# Patient Record
Sex: Female | Born: 1975 | Hispanic: No | Marital: Married | Smoking: Never smoker
Health system: Southern US, Community
[De-identification: ages and names within clinical notes are randomized; demographics above are authoritative.]

## PROBLEM LIST (undated history)

## (undated) ENCOUNTER — Inpatient Hospital Stay (HOSPITAL_COMMUNITY): Payer: Self-pay

## (undated) DIAGNOSIS — Z789 Other specified health status: Secondary | ICD-10-CM

## (undated) DIAGNOSIS — F32A Depression, unspecified: Secondary | ICD-10-CM

## (undated) DIAGNOSIS — K219 Gastro-esophageal reflux disease without esophagitis: Secondary | ICD-10-CM

## (undated) DIAGNOSIS — Z8719 Personal history of other diseases of the digestive system: Secondary | ICD-10-CM

## (undated) DIAGNOSIS — R87629 Unspecified abnormal cytological findings in specimens from vagina: Secondary | ICD-10-CM

## (undated) DIAGNOSIS — M1990 Unspecified osteoarthritis, unspecified site: Secondary | ICD-10-CM

## (undated) HISTORY — PX: GYNECOLOGIC CRYOSURGERY: SHX857

## (undated) HISTORY — PX: CYSTECTOMY: SUR359

## (undated) HISTORY — PX: TUBAL LIGATION: SHX77

## (undated) HISTORY — DX: Unspecified osteoarthritis, unspecified site: M19.90

## (undated) HISTORY — DX: Other specified health status: Z78.9

## (undated) HISTORY — PX: BUNIONECTOMY: SHX129

## (undated) HISTORY — DX: Depression, unspecified: F32.A

## (undated) HISTORY — PX: CHOLECYSTECTOMY: SHX55

---

## 2016-05-06 ENCOUNTER — Other Ambulatory Visit (HOSPITAL_COMMUNITY): Payer: Self-pay | Admitting: Obstetrics

## 2016-05-06 DIAGNOSIS — Z3149 Encounter for other procreative investigation and testing: Secondary | ICD-10-CM

## 2016-05-13 ENCOUNTER — Ambulatory Visit (HOSPITAL_COMMUNITY)
Admission: RE | Admit: 2016-05-13 | Discharge: 2016-05-13 | Disposition: A | Discharge status: Home / Self Care | Payer: Managed Care, Other (non HMO) | Source: Ambulatory Visit | Attending: Obstetrics | Admitting: Obstetrics

## 2016-05-13 ENCOUNTER — Encounter (HOSPITAL_COMMUNITY): Payer: Self-pay | Admitting: Radiology

## 2016-05-13 DIAGNOSIS — Z3149 Encounter for other procreative investigation and testing: Secondary | ICD-10-CM | POA: Diagnosis present

## 2016-05-13 MED ORDER — IOPAMIDOL (ISOVUE-300) INJECTION 61%
30.0000 mL | Freq: Once | INTRAVENOUS | Status: AC | PRN
  Administered 2016-05-13: 3 mL

## 2017-05-16 LAB — OB RESULTS CONSOLE RPR: RPR: NONREACTIVE

## 2017-05-16 LAB — OB RESULTS CONSOLE RUBELLA ANTIBODY, IGM: RUBELLA: IMMUNE

## 2017-05-16 LAB — OB RESULTS CONSOLE ABO/RH: RH Type: POSITIVE

## 2017-05-16 LAB — OB RESULTS CONSOLE GC/CHLAMYDIA
CHLAMYDIA, DNA PROBE: NEGATIVE
GC PROBE AMP, GENITAL: NEGATIVE

## 2017-05-16 LAB — OB RESULTS CONSOLE HIV ANTIBODY (ROUTINE TESTING): HIV: NONREACTIVE

## 2017-05-16 LAB — OB RESULTS CONSOLE ANTIBODY SCREEN: Antibody Screen: NEGATIVE

## 2017-05-16 LAB — OB RESULTS CONSOLE HEPATITIS B SURFACE ANTIGEN: Hepatitis B Surface Ag: NEGATIVE

## 2017-11-12 LAB — OB RESULTS CONSOLE GBS: GBS: NEGATIVE

## 2017-11-28 ENCOUNTER — Encounter (HOSPITAL_COMMUNITY): Payer: Self-pay | Admitting: *Deleted

## 2017-11-28 ENCOUNTER — Telehealth (HOSPITAL_COMMUNITY): Payer: Self-pay | Admitting: *Deleted

## 2017-11-28 NOTE — Telephone Encounter (Signed)
Preadmission screen  

## 2017-12-01 ENCOUNTER — Other Ambulatory Visit: Payer: Self-pay | Admitting: Obstetrics

## 2017-12-10 ENCOUNTER — Other Ambulatory Visit: Payer: Self-pay

## 2017-12-10 ENCOUNTER — Inpatient Hospital Stay (HOSPITAL_COMMUNITY)
Admission: AD | Admit: 2017-12-10 | Discharge: 2017-12-10 | Disposition: A | Discharge status: Home / Self Care | Payer: Managed Care, Other (non HMO) | Source: Ambulatory Visit | Attending: Obstetrics and Gynecology | Admitting: Obstetrics and Gynecology

## 2017-12-10 ENCOUNTER — Encounter (HOSPITAL_COMMUNITY): Payer: Self-pay

## 2017-12-10 DIAGNOSIS — O3663X0 Maternal care for excessive fetal growth, third trimester, not applicable or unspecified: Secondary | ICD-10-CM | POA: Diagnosis not present

## 2017-12-10 DIAGNOSIS — G44209 Tension-type headache, unspecified, not intractable: Secondary | ICD-10-CM

## 2017-12-10 DIAGNOSIS — O26893 Other specified pregnancy related conditions, third trimester: Secondary | ICD-10-CM | POA: Diagnosis not present

## 2017-12-10 LAB — CBC WITH DIFFERENTIAL/PLATELET
BASOS PCT: 0 %
Basophils Absolute: 0 10*3/uL (ref 0.0–0.1)
Eosinophils Absolute: 0.2 10*3/uL (ref 0.0–0.7)
Eosinophils Relative: 2 %
HEMATOCRIT: 35.6 % — AB (ref 36.0–46.0)
Hemoglobin: 12 g/dL (ref 12.0–15.0)
LYMPHS ABS: 2.8 10*3/uL (ref 0.7–4.0)
LYMPHS PCT: 24 %
MCH: 31.7 pg (ref 26.0–34.0)
MCHC: 33.7 g/dL (ref 30.0–36.0)
MCV: 93.9 fL (ref 78.0–100.0)
MONO ABS: 0.6 10*3/uL (ref 0.1–1.0)
MONOS PCT: 5 %
NEUTROS ABS: 8.1 10*3/uL — AB (ref 1.7–7.7)
Neutrophils Relative %: 69 %
Platelets: 309 10*3/uL (ref 150–400)
RBC: 3.79 MIL/uL — ABNORMAL LOW (ref 3.87–5.11)
RDW: 14 % (ref 11.5–15.5)
WBC: 11.7 10*3/uL — ABNORMAL HIGH (ref 4.0–10.5)

## 2017-12-10 LAB — COMPREHENSIVE METABOLIC PANEL
ALT: 12 U/L — ABNORMAL LOW (ref 14–54)
ANION GAP: 10 (ref 5–15)
AST: 21 U/L (ref 15–41)
Albumin: 3 g/dL — ABNORMAL LOW (ref 3.5–5.0)
Alkaline Phosphatase: 247 U/L — ABNORMAL HIGH (ref 38–126)
BILIRUBIN TOTAL: 0.5 mg/dL (ref 0.3–1.2)
BUN: 14 mg/dL (ref 6–20)
CALCIUM: 8.6 mg/dL — AB (ref 8.9–10.3)
CO2: 19 mmol/L — ABNORMAL LOW (ref 22–32)
Chloride: 103 mmol/L (ref 101–111)
Creatinine, Ser: 0.68 mg/dL (ref 0.44–1.00)
Glucose, Bld: 79 mg/dL (ref 65–99)
Potassium: 4.1 mmol/L (ref 3.5–5.1)
Sodium: 132 mmol/L — ABNORMAL LOW (ref 135–145)
TOTAL PROTEIN: 6.9 g/dL (ref 6.5–8.1)

## 2017-12-10 LAB — PROTEIN / CREATININE RATIO, URINE
CREATININE, URINE: 135 mg/dL
PROTEIN CREATININE RATIO: 0.23 mg/mg{creat} — AB (ref 0.00–0.15)
Total Protein, Urine: 31 mg/dL

## 2017-12-10 MED ORDER — CYCLOBENZAPRINE HCL 10 MG PO TABS
10.0000 mg | ORAL_TABLET | Freq: Once | ORAL | Status: AC
  Administered 2017-12-10: 10 mg via ORAL
  Filled 2017-12-10: qty 1

## 2017-12-10 MED ORDER — CYCLOBENZAPRINE HCL 10 MG PO TABS: 10.0000 mg | ORAL_TABLET | Freq: Three times a day (TID) | ORAL | 0 refills | Status: DC | PRN

## 2017-12-10 NOTE — MAU Provider Note (Signed)
History     Chief Complaint  Patient presents with  . Headache  . Hypertension  . Foot Swelling   41 yo G2P0010 BF @ 39 5/[redacted] weeks gestation sent from office by Dr Amado Nash for evaluation of c/o posterior  Headache. Pt reports headache present since this morning . She has not taken any med for the h/a and notes it is aggravated by moving head. Denies epigastric pain or visual changes. Per pt, while her BP is not " super" high it is  Higher than her usual BP. Also c/o leg more swollen than usual.   OB History    Gravida Para Term Preterm AB Living   2       1     SAB TAB Ectopic Multiple Live Births   1              Past Medical History:  Diagnosis Date  . Medical history non-contributory     Past Surgical History:  Procedure Laterality Date  . BUNIONECTOMY    . CYSTECTOMY    . GYNECOLOGIC CRYOSURGERY      Family History  Problem Relation Age of Onset  . Thrombosis Father   . Breast cancer Paternal Aunt   . Colon cancer Paternal Aunt     Social History   Tobacco Use  . Smoking status: Never Smoker  . Smokeless tobacco: Never Used  Substance Use Topics  . Alcohol use: No    Frequency: Never  . Drug use: No    Allergies:  Allergies  Allergen Reactions  . Sulfa Antibiotics     No medications prior to admission.     Physical Exam   Blood pressure 125/75, pulse 72, temperature 98 F (36.7 C), temperature source Oral, resp. rate 17, weight 101 kg (222 lb 12 oz), SpO2 100 %.  General appearance: alert, cooperative and no distress Lungs: clear to auscultation bilaterally Heart: regular rate and rhythm, S1, S2 normal, no murmur, click, rub or gallop Abdomen: gravid nontender Extremities: no edema, redness or tenderness in the calves or thighs  Tracing. Reactive baseline 135 (+) accel 15 x 15 IMP:  Headache  Term gestation P) PIH labs.  ED Course   MDM Addendum:  CBC    Component Value Date/Time   WBC 11.7 (H) 12/10/2017 1737   RBC 3.79 (L)  12/10/2017 1737   HGB 12.0 12/10/2017 1737   HCT 35.6 (L) 12/10/2017 1737   PLT 309 12/10/2017 1737   MCV 93.9 12/10/2017 1737   MCH 31.7 12/10/2017 1737   MCHC 33.7 12/10/2017 1737   RDW 14.0 12/10/2017 1737   LYMPHSABS 2.8 12/10/2017 1737   MONOABS 0.6 12/10/2017 1737   EOSABS 0.2 12/10/2017 1737   BASOSABS 0.0 12/10/2017 1737   CMP Latest Ref Rng & Units 12/10/2017  Glucose 65 - 99 mg/dL 79  BUN 6 - 20 mg/dL 14  Creatinine 4.09 - 8.11 mg/dL 9.14  Sodium 782 - 956 mmol/L 132(L)  Potassium 3.5 - 5.1 mmol/L 4.1  Chloride 101 - 111 mmol/L 103  CO2 22 - 32 mmol/L 19(L)  Calcium 8.9 - 10.3 mg/dL 2.1(H)  Total Protein 6.5 - 8.1 g/dL 6.9  Total Bilirubin 0.3 - 1.2 mg/dL 0.5  Alkaline Phos 38 - 126 U/L 247(H)  AST 15 - 41 U/L 21  ALT 14 - 54 U/L 12(L)  protein/creatinine ratio: .23 IMP tension headache P) flexeril 10mg  po x 1now. Script given for prn use.  D/c home. Keep scheduled appointment  Lisa Strickland  Lisa BeamsA Lisa Harriott, MD 6:04 PM 12/10/2017

## 2017-12-10 NOTE — MAU Note (Signed)
Went for non-stress test.  Has a headache (has not taken anything), BP was elevated, increased swelling, protein in urine.  Sent for further eval.  Is to be induced on Fri.  Denies spots, or epigastric pain.

## 2017-12-10 NOTE — Discharge Instructions (Signed)
Call fri for urine culture results. Labor prec

## 2017-12-11 NOTE — H&P (Signed)
Estelle JuneSaranah Cocco is a 42 y.o. G2P0010 at 1486w0d presenting for IOL given advanced AMA at 41yo and marginal cord insertion . Pt notes rare contractions. Good fetal movement, No vaginal bleeding, not leaking fluid/  [facility] - Dated by IVF and early u/s - AMA. Normal PGD. AFP/quad returned as a positive screen due to >1/100 risk based on age alone but quad screen reduced her risk compared to risk of age alone - Marginal cord insertion - High weight gain- 47# - GERD - Fetal testing. U/s at 36 wks 6'9, 85%, AFI 19. Nl weekly fetal testing.    Prenatal Transfer Tool  Maternal Diabetes: No Genetic Screening: Normal Maternal Ultrasounds/Referrals: Normal Fetal Ultrasounds or other Referrals:  None Maternal Substance Abuse:  No Significant Maternal Medications:  None Significant Maternal Lab Results: None     OB History    Gravida Para Term Preterm AB Living   2       1     SAB TAB Ectopic Multiple Live Births   1             Past Medical History:  Diagnosis Date  . Medical history non-contributory    Past Surgical History:  Procedure Laterality Date  . BUNIONECTOMY    . CYSTECTOMY    . GYNECOLOGIC CRYOSURGERY     Family History: family history includes Breast cancer in her paternal aunt; Colon cancer in her paternal aunt; Thrombosis in her father. Social History:  reports that  has never smoked. she has never used smokeless tobacco. She reports that she does not drink alcohol or use drugs.  Review of Systems - Negative except discomfort of pregnancy     There were no vitals taken for this visit.  Physical Exam: per admitting MD    Prenatal labs: ABO, Rh: B/Positive/-- (07/27 0000) Antibody: Negative (07/27 0000) Rubella: Immune (07/27 0000) RPR: Nonreactive (07/27 0000)  HBsAg: Negative (07/27 0000)  HIV: Non-reactive (07/27 0000)  GBS: Negative (01/23 0000)  1 hr Glucola 87  Genetic screening normal PGD, quad lowers risk vs age  alone Anatomy US normal   Assessment/Plan: 42 y.o. G2P0010 at 40'0 - Plan IOL, cytotec x 2 then pitocin in am. AROM when able. - GBS neg - AMA - LGA with borderline pelvis, attempt at vaginal delivery   Lendon ColonelKelly A Arshad Oberholzer 12/11/2017, 10:14 PM

## 2017-12-12 ENCOUNTER — Inpatient Hospital Stay (HOSPITAL_COMMUNITY): Payer: Managed Care, Other (non HMO) | Admitting: Anesthesiology

## 2017-12-12 ENCOUNTER — Inpatient Hospital Stay (HOSPITAL_COMMUNITY)
Admission: RE | Admit: 2017-12-12 | Discharge: 2017-12-15 | DRG: 787 | Disposition: A | Discharge status: Home / Self Care | Payer: Managed Care, Other (non HMO) | Source: Ambulatory Visit | Attending: Obstetrics and Gynecology | Admitting: Obstetrics and Gynecology

## 2017-12-12 ENCOUNTER — Other Ambulatory Visit: Payer: Self-pay

## 2017-12-12 ENCOUNTER — Encounter (HOSPITAL_COMMUNITY): Payer: Self-pay

## 2017-12-12 DIAGNOSIS — O99214 Obesity complicating childbirth: Secondary | ICD-10-CM | POA: Diagnosis present

## 2017-12-12 DIAGNOSIS — O324XX0 Maternal care for high head at term, not applicable or unspecified: Secondary | ICD-10-CM | POA: Diagnosis present

## 2017-12-12 DIAGNOSIS — O26893 Other specified pregnancy related conditions, third trimester: Secondary | ICD-10-CM | POA: Diagnosis present

## 2017-12-12 DIAGNOSIS — O43123 Velamentous insertion of umbilical cord, third trimester: Secondary | ICD-10-CM | POA: Diagnosis present

## 2017-12-12 DIAGNOSIS — E669 Obesity, unspecified: Secondary | ICD-10-CM | POA: Diagnosis present

## 2017-12-12 DIAGNOSIS — Z3A40 40 weeks gestation of pregnancy: Secondary | ICD-10-CM

## 2017-12-12 DIAGNOSIS — Z3490 Encounter for supervision of normal pregnancy, unspecified, unspecified trimester: Secondary | ICD-10-CM | POA: Diagnosis present

## 2017-12-12 DIAGNOSIS — D62 Acute posthemorrhagic anemia: Secondary | ICD-10-CM | POA: Diagnosis not present

## 2017-12-12 DIAGNOSIS — O3663X0 Maternal care for excessive fetal growth, third trimester, not applicable or unspecified: Secondary | ICD-10-CM | POA: Diagnosis present

## 2017-12-12 DIAGNOSIS — O9081 Anemia of the puerperium: Secondary | ICD-10-CM | POA: Diagnosis not present

## 2017-12-12 LAB — COMPREHENSIVE METABOLIC PANEL
ALT: 11 U/L — ABNORMAL LOW (ref 14–54)
AST: 20 U/L (ref 15–41)
Albumin: 2.8 g/dL — ABNORMAL LOW (ref 3.5–5.0)
Alkaline Phosphatase: 238 U/L — ABNORMAL HIGH (ref 38–126)
Anion gap: 8 (ref 5–15)
BUN: 11 mg/dL (ref 6–20)
CHLORIDE: 104 mmol/L (ref 101–111)
CO2: 20 mmol/L — ABNORMAL LOW (ref 22–32)
Calcium: 8.7 mg/dL — ABNORMAL LOW (ref 8.9–10.3)
Creatinine, Ser: 0.66 mg/dL (ref 0.44–1.00)
GFR calc Af Amer: >60 mL/min (ref >60)
GLUCOSE: 83 mg/dL (ref 65–99)
POTASSIUM: 4 mmol/L (ref 3.5–5.1)
Sodium: 132 mmol/L — ABNORMAL LOW (ref 135–145)
Total Bilirubin: 0.4 mg/dL (ref 0.3–1.2)
Total Protein: 6.7 g/dL (ref 6.5–8.1)

## 2017-12-12 LAB — CBC
HCT: 33.7 % — ABNORMAL LOW (ref 36.0–46.0)
HEMOGLOBIN: 11.3 g/dL — AB (ref 12.0–15.0)
MCH: 31.6 pg (ref 26.0–34.0)
MCHC: 33.5 g/dL (ref 30.0–36.0)
MCV: 94.1 fL (ref 78.0–100.0)
PLATELETS: 302 10*3/uL (ref 150–400)
RBC: 3.58 MIL/uL — AB (ref 3.87–5.11)
RDW: 13.9 % (ref 11.5–15.5)
WBC: 11.5 10*3/uL — ABNORMAL HIGH (ref 4.0–10.5)

## 2017-12-12 LAB — TYPE AND SCREEN
ABO/RH(D): B POS
Antibody Screen: NEGATIVE

## 2017-12-12 LAB — RPR: RPR: NONREACTIVE

## 2017-12-12 LAB — ABO/RH: ABO/RH(D): B POS

## 2017-12-12 MED ORDER — BUTORPHANOL TARTRATE 1 MG/ML IJ SOLN
INTRAMUSCULAR | Status: AC
  Administered 2017-12-12: 1 mg via INTRAVENOUS
  Filled 2017-12-12: qty 1

## 2017-12-12 MED ORDER — ACETAMINOPHEN 325 MG PO TABS
650.0000 mg | ORAL_TABLET | ORAL | Status: DC | PRN
  Administered 2017-12-12: 650 mg via ORAL
  Filled 2017-12-12: qty 2

## 2017-12-12 MED ORDER — LACTATED RINGERS IV SOLN
500.0000 mL | Freq: Once | INTRAVENOUS | Status: AC
  Administered 2017-12-12: 500 mL via INTRAVENOUS

## 2017-12-12 MED ORDER — PHENYLEPHRINE 40 MCG/ML (10ML) SYRINGE FOR IV PUSH (FOR BLOOD PRESSURE SUPPORT)
80.0000 ug | PREFILLED_SYRINGE | INTRAVENOUS | Status: DC | PRN
Start: 2017-12-12 — End: 2017-12-13
  Filled 2017-12-12: qty 10

## 2017-12-12 MED ORDER — SOD CITRATE-CITRIC ACID 500-334 MG/5ML PO SOLN
30.0000 mL | ORAL | Status: DC | PRN
Start: 2017-12-12 — End: 2017-12-13
  Administered 2017-12-13 (×2): 30 mL via ORAL
  Filled 2017-12-12 (×2): qty 15

## 2017-12-12 MED ORDER — ZOLPIDEM TARTRATE 5 MG PO TABS
5.0000 mg | ORAL_TABLET | Freq: Every evening | ORAL | Status: DC | PRN
  Administered 2017-12-12: 5 mg via ORAL
  Filled 2017-12-12: qty 1

## 2017-12-12 MED ORDER — LACTATED RINGERS IV SOLN
INTRAVENOUS | Status: DC
  Administered 2017-12-12 (×2): via INTRAVENOUS

## 2017-12-12 MED ORDER — EPHEDRINE 5 MG/ML INJ: 10.0000 mg | INTRAVENOUS | Status: DC | PRN

## 2017-12-12 MED ORDER — LACTATED RINGERS IV SOLN: 500.0000 mL | Freq: Once | INTRAVENOUS | Status: DC

## 2017-12-12 MED ORDER — FENTANYL 2.5 MCG/ML BUPIVACAINE 1/10 % EPIDURAL INFUSION (WH - ANES)
14.0000 mL/h | INTRAMUSCULAR | Status: DC | PRN
  Administered 2017-12-12 (×2): 14 mL/h via EPIDURAL
  Filled 2017-12-12 (×2): qty 100

## 2017-12-12 MED ORDER — OXYTOCIN 40 UNITS IN LACTATED RINGERS INFUSION - SIMPLE MED
1.0000 m[IU]/min | INTRAVENOUS | Status: DC
  Administered 2017-12-12: 2 m[IU]/min via INTRAVENOUS
  Filled 2017-12-12: qty 1000

## 2017-12-12 MED ORDER — TERBUTALINE SULFATE 1 MG/ML IJ SOLN: 0.2500 mg | Freq: Once | INTRAMUSCULAR | Status: DC | PRN

## 2017-12-12 MED ORDER — DIPHENHYDRAMINE HCL 50 MG/ML IJ SOLN: 12.5000 mg | INTRAMUSCULAR | Status: DC | PRN

## 2017-12-12 MED ORDER — LIDOCAINE HCL (PF) 1 % IJ SOLN
INTRAMUSCULAR | Status: DC | PRN
  Administered 2017-12-12 (×2): 4 mL via EPIDURAL

## 2017-12-12 MED ORDER — MISOPROSTOL 25 MCG QUARTER TABLET
25.0000 ug | ORAL_TABLET | ORAL | Status: AC | PRN
  Administered 2017-12-12 (×2): 25 ug via VAGINAL
  Filled 2017-12-12 (×2): qty 1

## 2017-12-12 MED ORDER — BUTORPHANOL TARTRATE 1 MG/ML IJ SOLN
1.0000 mg | Freq: Once | INTRAMUSCULAR | Status: AC
  Administered 2017-12-12: 1 mg via INTRAVENOUS

## 2017-12-12 MED ORDER — LACTATED RINGERS IV SOLN: 500.0000 mL | INTRAVENOUS | Status: DC | PRN

## 2017-12-12 MED ORDER — PHENYLEPHRINE 40 MCG/ML (10ML) SYRINGE FOR IV PUSH (FOR BLOOD PRESSURE SUPPORT): 80.0000 ug | PREFILLED_SYRINGE | INTRAVENOUS | Status: DC | PRN

## 2017-12-12 MED ORDER — ONDANSETRON HCL 4 MG/2ML IJ SOLN
4.0000 mg | Freq: Four times a day (QID) | INTRAMUSCULAR | Status: DC | PRN
  Administered 2017-12-12: 4 mg via INTRAVENOUS
  Filled 2017-12-12: qty 2

## 2017-12-12 MED ORDER — BUTORPHANOL TARTRATE 1 MG/ML IJ SOLN
1.0000 mg | Freq: Once | INTRAMUSCULAR | Status: AC
Start: 2017-12-12 — End: 2017-12-12
  Administered 2017-12-12: 1 mg via INTRAVENOUS
  Filled 2017-12-12: qty 1

## 2017-12-12 NOTE — Progress Notes (Addendum)
Comfortable with epidural  Patient Vitals for the past 24 hrs:  BP Temp Temp src Pulse Resp Height Weight  12/12/17 1931 136/75 98.1 F (36.7 C) Oral 81 18 - -  12/12/17 1901 138/71 - - 79 - - -  12/12/17 1830 (!) 145/87 - - 85 16 - -  12/12/17 1800 135/78 - - 70 18 - -  12/12/17 1730 129/72 - - 78 16 - -  12/12/17 1710 130/81 - - 76 18 - -  12/12/17 1700 129/76 - - 72 18 - -  12/12/17 1655 128/79 - - 70 16 - -  12/12/17 1650 124/77 - - 70 16 - -  12/12/17 1645 132/79 - - 69 16 - -  12/12/17 1640 131/77 98.6 F (37 C) Oral 71 16 - -  12/12/17 1636 139/83 - - 74 16 - -  12/12/17 1633 135/77 - - 77 18 - -  12/12/17 1628 137/76 - - 80 16 - -  12/12/17 1520 138/72 - - 80 16 - -  12/12/17 1441 (!) 145/79 97.9 F (36.6 C) Axillary 71 18 - -  12/12/17 1320 138/75 - - 76 16 - -  12/12/17 1250 140/69 - - 71 18 - -  12/12/17 1206 138/80 98.5 F (36.9 C) Oral 72 18 - -  12/12/17 1050 138/83 - - 78 18 - -  12/12/17 1049 - 98.7 F (37.1 C) Oral - - - -  12/12/17 0835 139/80 - - 77 18 - -  12/12/17 0730 127/78 - - 78 16 - -  12/12/17 0542 (!) 144/71 98.4 F (36.9 C) Oral 84 18 - -  12/12/17 0444 - - - - 18 - -  12/12/17 0341 - - - - 16 - -  12/12/17 0250 - - - - 18 - -  12/12/17 0140 137/73 - - 71 - - -  12/12/17 0137 137/73 98.1 F (36.7 C) Oral 73 18 - -  12/12/17 0127 (!) 143/76 - - 74 18 - -  12/12/17 0124 - 98.7 F (37.1 C) Oral - - 5' 5.5" (1.664 m) 224 lb (101.6 kg)   A&ox3 Normal respirations Abd: soft, nt, gravid Cx: 4/90/-3, cephalic LE: 2+ edema, nt bilat  FHT: 140, normal variability, +accels, no decels Toco: irregular q 2-3  A/P: iup at 40.0 wga 1. Active labor - continue pitocin, now at 4416mU/min; cervical change made but will follow closely, fetal station unchanged 2. Fetal status reassuring 3. AMA  4. Marginal cord insertion; efw 85% 6'9" 4 wks ago 5. gbs neg 6. ivf pregnancy  iupc placed d/t difficulty monitoring contractions with peanut Cx: 5/90/-3,  IUPC placed w/o difficulty Pt tolerated well

## 2017-12-12 NOTE — Progress Notes (Signed)
Pt beginning to feel contractions, mildly painful Plans epidural when slightly more painful  Patient Vitals for the past 24 hrs:  BP Temp Temp src Pulse Resp Height Weight  12/12/17 1520 138/72 - - 80 16 - -  12/12/17 1441 (!) 145/79 97.9 F (36.6 C) Axillary 71 18 - -  12/12/17 1320 138/75 - - 76 16 - -  12/12/17 1250 140/69 - - 71 18 - -  12/12/17 1206 138/80 98.5 F (36.9 C) Oral 72 18 - -  12/12/17 1050 138/83 - - 78 18 - -  12/12/17 1049 - 98.7 F (37.1 C) Oral - - - -  12/12/17 0835 139/80 - - 77 18 - -  12/12/17 0730 127/78 - - 78 16 - -  12/12/17 0542 (!) 144/71 98.4 F (36.9 C) Oral 84 18 - -  12/12/17 0444 - - - - 18 - -  12/12/17 0341 - - - - 16 - -  12/12/17 0250 - - - - 18 - -  12/12/17 0140 137/73 - - 71 - - -  12/12/17 0137 137/73 98.1 F (36.7 C) Oral 73 18 - -  12/12/17 0127 (!) 143/76 - - 74 18 - -  12/12/17 0124 - 98.7 F (37.1 C) Oral - - 5' 5.5" (1.664 m) 224 lb (101.6 kg)   A&ox3 Normal respirations Abd: soft, nt; efw 8-9 lbs LE: +2 edema, nt bilat  FHT: 130s, normal variability, +accels, no decels TOCO: irregular, mild ctx q 2-3 min  Results for orders placed or performed during the hospital encounter of 12/12/17 (from the past 24 hour(s))  CBC     Status: Abnormal   Collection Time: 12/12/17  1:09 AM  Result Value Ref Range   WBC 11.5 (H) 4.0 - 10.5 K/uL   RBC 3.58 (L) 3.87 - 5.11 MIL/uL   Hemoglobin 11.3 (L) 12.0 - 15.0 g/dL   HCT 47.833.7 (L) 29.536.0 - 62.146.0 %   MCV 94.1 78.0 - 100.0 fL   MCH 31.6 26.0 - 34.0 pg   MCHC 33.5 30.0 - 36.0 g/dL   RDW 30.813.9 65.711.5 - 84.615.5 %   Platelets 302 150 - 400 K/uL  RPR     Status: None   Collection Time: 12/12/17  1:09 AM  Result Value Ref Range   RPR Ser Ql Non Reactive Non Reactive  Type and screen     Status: None   Collection Time: 12/12/17  1:09 AM  Result Value Ref Range   ABO/RH(D) B POS    Antibody Screen NEG    Sample Expiration      12/15/2017 Performed at [facility], [address]  [place], [place] [address]   ABO/Rh     Status: None   Collection Time: 12/12/17  1:09 AM  Result Value Ref Range   ABO/RH(D)      B POS Performed at [facility], [address] [place], [place] [address]   Comprehensive metabolic panel     Status: Abnormal   Collection Time: 12/12/17  6:50 AM  Result Value Ref Range   Sodium 132 (L) 135 - 145 mmol/L   Potassium 4.0 3.5 - 5.1 mmol/L   Chloride 104 101 - 111 mmol/L   CO2 20 (L) 22 - 32 mmol/L   Glucose, Bld 83 65 - 99 mg/dL   BUN 11 6 - 20 mg/dL   Creatinine, Ser 2.440.66 0.44 - 1.00 mg/dL   Calcium 8.7 (L) 8.9 - 10.3 mg/dL   Total Protein  6.7 6.5 - 8.1 g/dL   Albumin 2.8 (L) 3.5 - 5.0 g/dL   AST 20 15 - 41 U/L   ALT 11 (L) 14 - 54 U/L   Alkaline Phosphatase 238 (H) 38 - 126 U/L   Total Bilirubin 0.4 0.3 - 1.2 mg/dL   GFR calc non Af Amer >60 >60 mL/min   GFR calc Af Amer >60 >60 mL/min   Anion gap 8 5 - 15   A/p: iup at 40.0 1. Continue pitocin IOL - plan svd, appears lga and will watch for arrest of labor 2. Fetal status reassuring 3. ivf 4. ama

## 2017-12-12 NOTE — Anesthesia Pain Management Evaluation Note (Signed)
  CRNA Pain Management Visit Note  Patient: Lisa Strickland, 42 y.o., female  "Hello I am a member of the anesthesia team at [facility]. We have an anesthesia team available at all times to provide care throughout the hospital, including epidural management and anesthesia for C-section. I don't know your plan for the delivery whether it a natural birth, water birth, IV sedation, nitrous supplementation, doula or epidural, but we want to meet your pain goals."   1.Was your pain managed to your expectations on prior hospitalizations?   Yes   2.What is your expectation for pain management during this hospitalization?     Epidural  3.How can we help you reach that goal? epidural  Record the patient's initial score and the patient's pain goal.   Pain: 4  Pain Goal: 6 [facility] wants you to be able to say your pain was always managed very well.  Juan Olthoff 12/12/2017

## 2017-12-12 NOTE — Anesthesia Preprocedure Evaluation (Addendum)
Anesthesia Evaluation  Patient identified by MRN, date of birth, ID band Patient awake    Reviewed: Allergy & Precautions, Patient's Chart, lab work & pertinent test results  Airway Mallampati: III  TM Distance: >3 FB Neck ROM: Full    Dental no notable dental hx. (+) Teeth Intact   Pulmonary neg pulmonary ROS,    Pulmonary exam normal breath sounds clear to auscultation       Cardiovascular negative cardio ROS Normal cardiovascular exam Rhythm:Regular Rate:Normal     Neuro/Psych negative neurological ROS  negative psych ROS   GI/Hepatic Neg liver ROS, GERD  ,  Endo/Other  Obesity  Renal/GU negative Renal ROS  negative genitourinary   Musculoskeletal negative musculoskeletal ROS (+)   Abdominal (+) + obese,   Peds  Hematology negative hematology ROS (+)   Anesthesia Other Findings   Reproductive/Obstetrics (+) Pregnancy                             Anesthesia Physical Anesthesia Plan  ASA: II  Anesthesia Plan: Epidural   Post-op Pain Management:    Induction:   PONV Risk Score and Plan:   Airway Management Planned: Natural Airway  Additional Equipment:   Intra-op Plan:   Post-operative Plan:   Informed Consent: I have reviewed the patients History and Physical, chart, labs and discussed the procedure including the risks, benefits and alternatives for the proposed anesthesia with the patient or authorized representative who has indicated his/her understanding and acceptance.       Plan Discussed with: Anesthesiologist  Anesthesia Plan Comments:         Anesthesia Quick Evaluation  

## 2017-12-12 NOTE — Progress Notes (Signed)
Patient ID: Lisa Strickland, female   DOB: 1975/11/09, 42 y.o.   MRN: [id number] History of chronic HA BP (!) 144/71 (BP Location: Right Arm)   Pulse 84   Temp 98.4 F (36.9 C) (Oral)   Resp 18   Ht 5' 5.5" (1.664 m)   Wt 101.6 kg (224 lb)   BMI 36.71 kg/m   Will rpt labs No history of HTN

## 2017-12-12 NOTE — Progress Notes (Signed)
Lisa Strickland is a 42 y.o. G2P0010 at 616w0d by LMP admitted for induction of labor [facility].  Subjective: crampy  Objective: BP 139/80   Pulse 77   Temp 98.4 F (36.9 C) (Oral)   Resp 18   Ht 5' 5.5" (1.664 m)   Wt 101.6 kg (224 lb)   BMI 36.71 kg/m  No intake/output data recorded. No intake/output data recorded.  FHT:  FHR: 145 bpm, variability: moderate,  accelerations:  Present,  decelerations:  Absent UC:   irregular, every 2-4 minutes SVE:   Dilation: Closed Effacement (%): Thick Station: -1 Exam by:: Foye ClockS. Oklesh, RN  RN confirms clear SROM at 0825 Labs: Lab Results  Component Value Date   WBC 11.5 (H) 12/12/2017   HGB 11.3 (L) 12/12/2017   HCT 33.7 (L) 12/12/2017   MCV 94.1 12/12/2017   PLT 302 12/12/2017   CMP     Component Value Date/Time   NA 132 (L) 12/12/2017 0650   K 4.0 12/12/2017 0650   CL 104 12/12/2017 0650   CO2 20 (L) 12/12/2017 0650   GLUCOSE 83 12/12/2017 0650   BUN 11 12/12/2017 0650   CREATININE 0.66 12/12/2017 0650   CALCIUM 8.7 (L) 12/12/2017 0650   PROT 6.7 12/12/2017 0650   ALBUMIN 2.8 (L) 12/12/2017 0650   AST 20 12/12/2017 0650   ALT 11 (L) 12/12/2017 0650   ALKPHOS 238 (H) 12/12/2017 0650   BILITOT 0.4 12/12/2017 0650   GFRNONAA >60 12/12/2017 0650   GFRAA >60 12/12/2017 0650    Assessment / Plan: AMA  Marginal PCI SROM LGA  Labor: add Pitocin Preeclampsia:  no signs or symptoms of toxicity, intake and ouput balanced and labs stable Fetal Wellbeing:  Category I Pain Control:  Labor support without medications I/D:  n/a Anticipated MOD:  guarded  Tyronica Truxillo J 12/12/2017, 10:37 AM

## 2017-12-12 NOTE — Anesthesia Procedure Notes (Signed)
Epidural Patient location during procedure: OB Start time: 12/12/2017 4:28 PM  Staffing Anesthesiologist: Mal AmabileFoster, Dolores Ewing, MD Performed: anesthesiologist   Preanesthetic Checklist Completed: patient identified, site marked, surgical consent, pre-op evaluation, timeout performed, IV checked, risks and benefits discussed and monitors and equipment checked  Epidural Patient position: sitting Prep: site prepped and draped and DuraPrep Patient monitoring: continuous pulse ox and blood pressure Approach: midline Location: L3-L4 Injection technique: LOR air  Needle:  Needle type: Tuohy  Needle gauge: 17 G Needle length: 9 cm and 9 Needle insertion depth: 7 cm Catheter type: closed end flexible Catheter size: 19 Gauge Catheter at skin depth: 12 cm Test dose: negative and Other  Assessment Events: blood not aspirated, injection not painful, no injection resistance, negative IV test and no paresthesia  Additional Notes Patient identified. Risks and benefits discussed including failed block, incomplete  Pain control, post dural puncture headache, nerve damage, paralysis, blood pressure Changes, nausea, vomiting, reactions to medications-both toxic and allergic and post Partum back pain. All questions were answered. Patient expressed understanding and wished to proceed. Sterile technique was used throughout procedure. Epidural site was Dressed with sterile barrier dressing. No paresthesias, signs of intravascular injection Or signs of intrathecal spread were encountered.  Patient was more comfortable after the epidural was dosed. Please see RN's note for documentation of vital signs and FHR which are stable. .Marland Kitchen

## 2017-12-12 NOTE — H&P (Addendum)
Lisa Strickland is a 42 y.o. G2P0010 at 7465w0d presenting for IOL given advanced AMA at 41yo and marginal cord insertion . Pt notes rare contractions. Good fetal movement, No vaginal bleeding, not leaking fluid/  [facility] since 10 w[facility] - Dated by IVF and early u/s - AMA. Normal PGD. AFP/quad returned as a positive screen due to >1/100 risk based on age alone but quad screen reduced her risk compared to risk of age alone - Marginal cord insertion - High weight gain- 47# - GERD - Fetal testing. U/s at 36 wks 6'9, 85%, AFI 19. Nl weekly fetal testing.    Prenatal Transfer Tool  Maternal Diabetes: No Genetic Screening: Normal Maternal Ultrasounds/Referrals: Normal Fetal Ultrasounds or other Referrals:  None Maternal Substance Abuse:  No Significant Maternal Medications:  None Significant Maternal Lab Results: None     OB History    Gravida Para Term Preterm AB Living   2       1     SAB TAB Ectopic Multiple Live Births   1             Past Medical History:  Diagnosis Date  . Medical history non-contributory    Past Surgical History:  Procedure Laterality Date  . BUNIONECTOMY    . CYSTECTOMY    . GYNECOLOGIC CRYOSURGERY     Family History: family history includes Breast cancer in her paternal aunt; Colon cancer in her paternal aunt; Thrombosis in her father. Social History:  reports that  has never smoked. she has never used smokeless tobacco. She reports that she does not drink alcohol or use drugs.  Review of Systems - Negative except discomfort of pregnancy   Dilation: Closed Effacement (%): Thick Station: Ballotable Exam by:: Cyndie MullL. Johnson, RN Blood pressure (!) 144/71, pulse 84, temperature 98.4 F (36.9 C), temperature source Oral, resp. rate 18, height 5' 5.5" (1.664 m), weight 101.6 kg (224 lb).  Physical Exam:  WDWF NAD Neck supple CV RRR ABD gravid NT EXT neg cords, DTRs 2+ Neuro : nonfocal SkIn: intact   Prenatal labs: ABO, Rh: --/--/B  POS, B POS Performed at [facility], [address] [place], [place] [address]  [phone](02/22 0109) Antibody: NEG (02/22 0109) Rubella: Immune (07/27 0000) RPR: Nonreactive (07/27 0000)  HBsAg: Negative (07/27 0000)  HIV: Non-reactive (07/27 0000)  GBS: Negative (01/23 0000)  1 hr Glucola 87  Genetic screening normal PGD, quad lowers risk vs age alone Anatomy US normal   Assessment/Plan: 42 y.o. G2P0010 at 40'0 - Plan IOL, cytotec x 2 then pitocin in am. AROM when able. - GBS neg - AMA - LGA with borderline pelvis, attempt at vaginal delivery   Zoriana Oats J 12/12/2017, 6:49 AM

## 2017-12-13 ENCOUNTER — Encounter (HOSPITAL_COMMUNITY)
Admission: RE | Disposition: A | Discharge status: Home / Self Care | Payer: Self-pay | Source: Ambulatory Visit | Attending: Obstetrics and Gynecology

## 2017-12-13 ENCOUNTER — Encounter (HOSPITAL_COMMUNITY): Payer: Self-pay

## 2017-12-13 LAB — CBC
HEMATOCRIT: 31.7 % — AB (ref 36.0–46.0)
Hemoglobin: 10.7 g/dL — ABNORMAL LOW (ref 12.0–15.0)
MCH: 31.5 pg (ref 26.0–34.0)
MCHC: 33.8 g/dL (ref 30.0–36.0)
MCV: 93.2 fL (ref 78.0–100.0)
Platelets: 255 10*3/uL (ref 150–400)
RBC: 3.4 MIL/uL — ABNORMAL LOW (ref 3.87–5.11)
RDW: 14.2 % (ref 11.5–15.5)
WBC: 20.7 10*3/uL — AB (ref 4.0–10.5)

## 2017-12-13 SURGERY — Surgical Case
Anesthesia: Epidural | Wound class: Clean Contaminated

## 2017-12-13 MED ORDER — NALOXONE HCL 4 MG/10ML IJ SOLN
1.0000 ug/kg/h | INTRAVENOUS | Status: DC | PRN
  Filled 2017-12-13: qty 5

## 2017-12-13 MED ORDER — SIMETHICONE 80 MG PO CHEW: 80.0000 mg | CHEWABLE_TABLET | ORAL | Status: DC | PRN

## 2017-12-13 MED ORDER — MORPHINE SULFATE (PF) 0.5 MG/ML IJ SOLN
INTRAMUSCULAR | Status: AC
Start: 2017-12-13 — End: ?
  Filled 2017-12-13: qty 10

## 2017-12-13 MED ORDER — NALBUPHINE HCL 10 MG/ML IJ SOLN: 5.0000 mg | Freq: Once | INTRAMUSCULAR | Status: DC | PRN

## 2017-12-13 MED ORDER — SODIUM CHLORIDE 0.9% FLUSH: 3.0000 mL | INTRAVENOUS | Status: DC | PRN

## 2017-12-13 MED ORDER — CEFAZOLIN SODIUM-DEXTROSE 2-4 GM/100ML-% IV SOLN
INTRAVENOUS | Status: AC
  Filled 2017-12-13: qty 100

## 2017-12-13 MED ORDER — PRENATAL MULTIVITAMIN CH
1.0000 | ORAL_TABLET | Freq: Every day | ORAL | Status: DC
  Administered 2017-12-13 – 2017-12-15 (×3): 1 via ORAL
  Filled 2017-12-13 (×3): qty 1

## 2017-12-13 MED ORDER — LACTATED RINGERS IV SOLN
INTRAVENOUS | Status: DC | PRN
  Administered 2017-12-13: 03:00:00 via INTRAVENOUS

## 2017-12-13 MED ORDER — NALBUPHINE HCL 10 MG/ML IJ SOLN: 5.0000 mg | INTRAMUSCULAR | Status: DC | PRN

## 2017-12-13 MED ORDER — DIPHENHYDRAMINE HCL 50 MG/ML IJ SOLN: 12.5000 mg | INTRAMUSCULAR | Status: DC | PRN

## 2017-12-13 MED ORDER — NALOXONE HCL 0.4 MG/ML IJ SOLN: 0.4000 mg | INTRAMUSCULAR | Status: DC | PRN

## 2017-12-13 MED ORDER — KETOROLAC TROMETHAMINE 30 MG/ML IJ SOLN
30.0000 mg | Freq: Four times a day (QID) | INTRAMUSCULAR | Status: DC | PRN
  Filled 2017-12-13: qty 1

## 2017-12-13 MED ORDER — LIDOCAINE-EPINEPHRINE (PF) 2 %-1:200000 IJ SOLN
INTRAMUSCULAR | Status: AC
  Filled 2017-12-13: qty 20

## 2017-12-13 MED ORDER — SCOPOLAMINE 1 MG/3DAYS TD PT72: 1.0000 | MEDICATED_PATCH | Freq: Once | TRANSDERMAL | Status: DC

## 2017-12-13 MED ORDER — LACTATED RINGERS IV SOLN
INTRAVENOUS | Status: DC | PRN
  Administered 2017-12-13: 40 [IU] via INTRAVENOUS

## 2017-12-13 MED ORDER — MENTHOL 3 MG MT LOZG: 1.0000 | LOZENGE | OROMUCOSAL | Status: DC | PRN

## 2017-12-13 MED ORDER — ONDANSETRON HCL 4 MG/2ML IJ SOLN: 4.0000 mg | Freq: Three times a day (TID) | INTRAMUSCULAR | Status: DC | PRN

## 2017-12-13 MED ORDER — SIMETHICONE 80 MG PO CHEW
80.0000 mg | CHEWABLE_TABLET | ORAL | Status: DC
  Administered 2017-12-14 – 2017-12-15 (×2): 80 mg via ORAL
  Filled 2017-12-13 (×2): qty 1

## 2017-12-13 MED ORDER — DIPHENHYDRAMINE HCL 25 MG PO CAPS
25.0000 mg | ORAL_CAPSULE | ORAL | Status: DC | PRN
  Filled 2017-12-13: qty 1

## 2017-12-13 MED ORDER — WITCH HAZEL-GLYCERIN EX PADS: 1.0000 "application " | MEDICATED_PAD | CUTANEOUS | Status: DC | PRN

## 2017-12-13 MED ORDER — LACTATED RINGERS IV SOLN
INTRAVENOUS | Status: DC
  Administered 2017-12-13: 15:00:00 via INTRAVENOUS

## 2017-12-13 MED ORDER — HYDROMORPHONE HCL 1 MG/ML IJ SOLN: 0.2500 mg | INTRAMUSCULAR | Status: DC | PRN

## 2017-12-13 MED ORDER — MORPHINE SULFATE (PF) 0.5 MG/ML IJ SOLN
INTRAMUSCULAR | Status: DC | PRN
  Administered 2017-12-13: 3 mg via EPIDURAL

## 2017-12-13 MED ORDER — SODIUM CHLORIDE 0.9 % IR SOLN
Status: DC | PRN
  Administered 2017-12-13: 1

## 2017-12-13 MED ORDER — ONDANSETRON HCL 4 MG/2ML IJ SOLN
INTRAMUSCULAR | Status: AC
  Filled 2017-12-13: qty 2

## 2017-12-13 MED ORDER — MEPERIDINE HCL 25 MG/ML IJ SOLN: 6.2500 mg | INTRAMUSCULAR | Status: DC | PRN

## 2017-12-13 MED ORDER — COCONUT OIL OIL
1.0000 "application " | TOPICAL_OIL | Status: DC | PRN
  Administered 2017-12-13: 1 via TOPICAL
  Filled 2017-12-13: qty 120

## 2017-12-13 MED ORDER — KETOROLAC TROMETHAMINE 30 MG/ML IJ SOLN
30.0000 mg | Freq: Four times a day (QID) | INTRAMUSCULAR | Status: DC | PRN
  Administered 2017-12-13: 30 mg via INTRAMUSCULAR

## 2017-12-13 MED ORDER — KETOROLAC TROMETHAMINE 30 MG/ML IJ SOLN
30.0000 mg | Freq: Four times a day (QID) | INTRAMUSCULAR | Status: DC | PRN
  Administered 2017-12-13: 30 mg via INTRAVENOUS

## 2017-12-13 MED ORDER — DEXAMETHASONE SODIUM PHOSPHATE 10 MG/ML IJ SOLN
INTRAMUSCULAR | Status: AC
Start: 2017-12-13 — End: ?
  Filled 2017-12-13: qty 1

## 2017-12-13 MED ORDER — LACTATED RINGERS IV SOLN
INTRAVENOUS | Status: DC | PRN
  Administered 2017-12-13 (×3): via INTRAVENOUS

## 2017-12-13 MED ORDER — OXYCODONE-ACETAMINOPHEN 5-325 MG PO TABS: 1.0000 | ORAL_TABLET | ORAL | Status: DC | PRN

## 2017-12-13 MED ORDER — CEFAZOLIN SODIUM-DEXTROSE 2-4 GM/100ML-% IV SOLN
2.0000 g | INTRAVENOUS | Status: AC
  Administered 2017-12-13: 2 g via INTRAVENOUS

## 2017-12-13 MED ORDER — ONDANSETRON HCL 4 MG/2ML IJ SOLN
INTRAMUSCULAR | Status: DC | PRN
  Administered 2017-12-13: 4 mg via INTRAVENOUS

## 2017-12-13 MED ORDER — OXYTOCIN 10 UNIT/ML IJ SOLN
INTRAMUSCULAR | Status: AC
  Filled 2017-12-13: qty 4

## 2017-12-13 MED ORDER — SODIUM BICARBONATE 8.4 % IV SOLN
INTRAVENOUS | Status: AC
  Filled 2017-12-13: qty 50

## 2017-12-13 MED ORDER — KETOROLAC TROMETHAMINE 30 MG/ML IJ SOLN
INTRAMUSCULAR | Status: AC
  Filled 2017-12-13: qty 1

## 2017-12-13 MED ORDER — SENNOSIDES-DOCUSATE SODIUM 8.6-50 MG PO TABS
2.0000 | ORAL_TABLET | ORAL | Status: DC
  Administered 2017-12-14 – 2017-12-15 (×2): 2 via ORAL
  Filled 2017-12-13 (×2): qty 2

## 2017-12-13 MED ORDER — SCOPOLAMINE 1 MG/3DAYS TD PT72
MEDICATED_PATCH | TRANSDERMAL | Status: DC | PRN
  Administered 2017-12-13: 1 via TRANSDERMAL

## 2017-12-13 MED ORDER — OXYCODONE-ACETAMINOPHEN 5-325 MG PO TABS: 2.0000 | ORAL_TABLET | ORAL | Status: DC | PRN

## 2017-12-13 MED ORDER — DIPHENHYDRAMINE HCL 25 MG PO CAPS: 25.0000 mg | ORAL_CAPSULE | Freq: Four times a day (QID) | ORAL | Status: DC | PRN

## 2017-12-13 MED ORDER — OXYTOCIN 40 UNITS IN LACTATED RINGERS INFUSION - SIMPLE MED: 2.5000 [IU]/h | INTRAVENOUS | Status: AC

## 2017-12-13 MED ORDER — ACETAMINOPHEN 325 MG PO TABS
650.0000 mg | ORAL_TABLET | ORAL | Status: DC | PRN
  Administered 2017-12-14: 650 mg via ORAL
  Filled 2017-12-13: qty 2

## 2017-12-13 MED ORDER — SIMETHICONE 80 MG PO CHEW
80.0000 mg | CHEWABLE_TABLET | Freq: Three times a day (TID) | ORAL | Status: DC
  Administered 2017-12-13 – 2017-12-15 (×7): 80 mg via ORAL
  Filled 2017-12-13 (×7): qty 1

## 2017-12-13 MED ORDER — KETOROLAC TROMETHAMINE 30 MG/ML IJ SOLN: 30.0000 mg | Freq: Four times a day (QID) | INTRAMUSCULAR | Status: DC | PRN

## 2017-12-13 MED ORDER — SCOPOLAMINE 1 MG/3DAYS TD PT72
MEDICATED_PATCH | TRANSDERMAL | Status: AC
  Filled 2017-12-13: qty 1

## 2017-12-13 MED ORDER — DEXAMETHASONE SODIUM PHOSPHATE 10 MG/ML IJ SOLN
INTRAMUSCULAR | Status: DC | PRN
  Administered 2017-12-13: 10 mg via INTRAVENOUS

## 2017-12-13 MED ORDER — SODIUM BICARBONATE 8.4 % IV SOLN
INTRAVENOUS | Status: DC | PRN
  Administered 2017-12-13: 3 mL via EPIDURAL
  Administered 2017-12-13: 7 mL via EPIDURAL

## 2017-12-13 MED ORDER — IBUPROFEN 600 MG PO TABS
600.0000 mg | ORAL_TABLET | Freq: Four times a day (QID) | ORAL | Status: DC
  Administered 2017-12-13 – 2017-12-14 (×5): 600 mg via ORAL
  Filled 2017-12-13 (×5): qty 1

## 2017-12-13 MED ORDER — DIBUCAINE 1 % RE OINT: 1.0000 "application " | TOPICAL_OINTMENT | RECTAL | Status: DC | PRN

## 2017-12-13 SURGICAL SUPPLY — 35 items
BENZOIN TINCTURE PRP APPL 2/3 (GAUZE/BANDAGES/DRESSINGS) ×3 IMPLANT
CHLORAPREP W/TINT 26ML (MISCELLANEOUS) ×3 IMPLANT
CLAMP CORD UMBIL (MISCELLANEOUS) IMPLANT
CLOSURE WOUND 1/2 X4 (GAUZE/BANDAGES/DRESSINGS) ×1
CLOTH BEACON ORANGE TIMEOUT ST (SAFETY) ×3 IMPLANT
DRSG OPSITE POSTOP 4X10 (GAUZE/BANDAGES/DRESSINGS) ×3 IMPLANT
ELECT REM PT RETURN 9FT ADLT (ELECTROSURGICAL) ×3
ELECTRODE REM PT RTRN 9FT ADLT (ELECTROSURGICAL) ×1 IMPLANT
EXTRACTOR VACUUM KIWI (MISCELLANEOUS) ×3 IMPLANT
GLOVE BIOGEL PI IND STRL 6.5 (GLOVE) ×1 IMPLANT
GLOVE BIOGEL PI IND STRL 7.0 (GLOVE) ×2 IMPLANT
GLOVE BIOGEL PI INDICATOR 6.5 (GLOVE) ×2
GLOVE BIOGEL PI INDICATOR 7.0 (GLOVE) ×4
GLOVE ECLIPSE 6.5 STRL STRAW (GLOVE) ×3 IMPLANT
GOWN STRL REUS W/TWL LRG LVL3 (GOWN DISPOSABLE) ×6 IMPLANT
HOVERMATT SINGLE USE (MISCELLANEOUS) ×3 IMPLANT
KIT ABG SYR 3ML LUER SLIP (SYRINGE) IMPLANT
NEEDLE HYPO 25X5/8 SAFETYGLIDE (NEEDLE) IMPLANT
NS IRRIG 1000ML POUR BTL (IV SOLUTION) ×3 IMPLANT
PACK C SECTION WH (CUSTOM PROCEDURE TRAY) ×3 IMPLANT
PAD OB MATERNITY 4.3X12.25 (PERSONAL CARE ITEMS) ×3 IMPLANT
PENCIL SMOKE EVAC W/HOLSTER (ELECTROSURGICAL) ×3 IMPLANT
STRIP CLOSURE SKIN 1/2X4 (GAUZE/BANDAGES/DRESSINGS) ×2 IMPLANT
SUT MNCRL 0 VIOLET CTX 36 (SUTURE) ×2 IMPLANT
SUT MONOCRYL 0 CTX 36 (SUTURE) ×4
SUT PDS AB 0 CTX 36 PDP370T (SUTURE) IMPLANT
SUT PLAIN 2 0 XLH (SUTURE) IMPLANT
SUT VIC AB 0 CT1 36 (SUTURE) ×6 IMPLANT
SUT VIC AB 2-0 CT1 27 (SUTURE) ×4
SUT VIC AB 2-0 CT1 TAPERPNT 27 (SUTURE) ×2 IMPLANT
SUT VIC AB 3-0 CT1 27 (SUTURE) ×4
SUT VIC AB 3-0 CT1 TAPERPNT 27 (SUTURE) ×2 IMPLANT
SUT VIC AB 4-0 PS2 27 (SUTURE) ×3 IMPLANT
TOWEL OR 17X24 6PK STRL BLUE (TOWEL DISPOSABLE) ×3 IMPLANT
TRAY FOLEY BAG SILVER LF 14FR (SET/KITS/TRAYS/PACK) IMPLANT

## 2017-12-13 NOTE — Anesthesia Postprocedure Evaluation (Signed)
Anesthesia Post Note  Patient: Estelle JuneSaranah Quesnell  Procedure(s) Performed: CESAREAN SECTION (N/A )     Anesthesia Type: Epidural Level of consciousness: awake and alert Pain management: pain level controlled Vital Signs Assessment: post-procedure vital signs reviewed and stable Respiratory status: spontaneous breathing Cardiovascular status: stable Postop Assessment: no headache, patient able to bend at knees, no backache, no apparent nausea or vomiting, epidural receding and adequate PO intake Anesthetic complications: no    Last Vitals:  Vitals:   12/13/17 0650 12/13/17 0800  BP:  (!) 144/70  Pulse:  68  Resp:  18  Temp:  37.1 C  SpO2: 97% 97%    Last Pain:  Vitals:   12/13/17 0800  TempSrc: Oral  PainSc:    Pain Goal: Patients Stated Pain Goal: 5 (12/13/17 0335)               Salome ArntSterling, Yaquelin Langelier Marie

## 2017-12-13 NOTE — Addendum Note (Signed)
Addendum  created 12/13/17 0910 by Armanda HeritageSterling, Clarabelle Oscarson M, CRNA   Sign clinical note

## 2017-12-13 NOTE — Plan of Care (Signed)
Patient is ambulating without any difficulty. Patient denies any pain or discomfort at this time. Fundus is firm and small bleeding noted on peripad during peri care.

## 2017-12-13 NOTE — Transfer of Care (Signed)
Immediate Anesthesia Transfer of Care Note  Patient: Lisa Strickland  Procedure(s) Performed: CESAREAN SECTION (N/A )  Patient Location: PACU  Anesthesia Type:Epidural  Level of Consciousness: awake  Airway & Oxygen Therapy: Patient Spontanous Breathing  Post-op Assessment: Report given to RN and Post -op Vital signs reviewed and stable  Post vital signs: stable  Last Vitals:  Vitals:   12/13/17 0100 12/13/17 0131  BP: (!) 144/83 140/80  Pulse: 91 90  Resp:  18  Temp:      Last Pain:  Vitals:   12/13/17 0030  TempSrc: Oral  PainSc:          Complications: No apparent anesthesia complications

## 2017-12-13 NOTE — Lactation Note (Signed)
This note was copied from a baby's chart. Lactation Consultation Note  Patient Name: Lisa Strickland GEXBM'WToday's Date: 12/13/2017 Reason for consult: Initial assessment;Primapara;1st time breastfeeding;Term  3612 hours old female who is being exclusively BF by her mother. Per parents, baby was very sleepy and did not feed [facility] (about 7 hours). LC woke up baby and put her on the breast, but baby would not latch, tried football and cross cradle, but no latch achieved. Checked baby's mouth, baby is able to protrude her tongue but when did suck training her suck was uncoordinated, did not suck in a pattern, it was more like suckling/bitting.  Tried latching baby on with a NS # 20 but baby still unable to achieve a latch, mom kept baby STS. Taught mom how to hand express and spoon fed about 3 ml of colostrum to baby. Baby had a large emesis about 8-10 ml that look yellowish shortly after feeding, showed parents how to burp baby.   Encouraged mom to wake up baby for feedings at least during the day to give her an opportunity to feed on feeding cues placing her STS 8-12 times a day. Mom will keep trying to latch baby with the # 20 NS and also pumping after each feeding at least 8 times/day. RN got coconut oil, showed mom how to use it prior pumping. Parents will feed baby fresh expressed colostrum after every BF session/attempt.   Reviewed BF brochure, BF resources and feeding diary, mom is aware of LC services and will call PRN.  Maternal Data Formula Feeding for Exclusion: No Has patient been taught Hand Expression?: Yes Does the patient have breastfeeding experience prior to this delivery?: No  Feeding    LATCH Score (baby unable to latch without NS or with NS)    Interventions Interventions: Breast feeding basics reviewed;Assisted with latch;Skin to skin;Breast massage;Hand express;Breast compression;Adjust position;Support pillows;Position options;Expressed milk;Coconut  oil;Hand pump;DEBP  Lactation Tools Discussed/Used Tools: Nipple Shields Nipple shield size: 20 WIC Program: No Pump Review: Setup, frequency, and cleaning;Milk Storage Initiated by:: MPeck Date initiated:: 12/13/17   Consult Status Consult Status: Follow-up Date: 12/14/17 Follow-up type: In-patient    Oyuki Hogan Venetia ConstableS Madelon Welsch 12/13/2017, 3:11 PM

## 2017-12-13 NOTE — Anesthesia Postprocedure Evaluation (Signed)
Anesthesia Post Note  Patient: Lisa Strickland  Procedure(s) Performed: CESAREAN SECTION (N/A )     Patient location during evaluation: [facility] Anesthesia Type: Epidural Level of consciousness: awake and alert Pain management: pain level controlled Vital Signs Assessment: post-procedure vital signs reviewed and stable Respiratory status: spontaneous breathing, nonlabored ventilation and respiratory function stable Cardiovascular status: stable Postop Assessment: no headache, no backache and epidural receding Anesthetic complications: no    Last Vitals:  Vitals:   12/13/17 0416 12/13/17 0430  BP: (!) 127/95 (!) 125/99  Pulse: 87 79  Resp: 16 (!) 8  Temp:    SpO2: 97% 97%    Last Pain:  Vitals:   12/13/17 0335  TempSrc: Oral  PainSc: 2    Pain Goal: Patients Stated Pain Goal: 5 (12/13/17 0335)               Trevor IhaStephen A Liala Codispoti

## 2017-12-13 NOTE — Op Note (Signed)
Cesarean Section Procedure Note   Lisa Strickland  12/13/2017  Indications: arrest of descent; nrfht    Pre-operative Diagnosis: Arrest of Descent.   Post-operative Diagnosis: Same   Surgeon: Surgeon(s) and Role:    * Edmond Ginsberg, Candace GallusSusan E, MD - Primary   Assistants: Marlinda Mikeanya Bailey, CNM  Anesthesia: epidural   Procedure Details:  The patient was informed of arrest of descent and recommendation for c/s.   The risks, benefits, complications, treatment options, and expected outcomes were discussed with the patient. The patient concurred with the proposed plan, giving informed consent. identified as Lisa Strickland and the procedure verified as C-Section Delivery. A Time Out was held and the above information confirmed.  After induction of anesthesia, the patient was draped and prepped in the usual sterile manner. A transverse was made and carried down through the subcutaneous tissue to the fascia. Fascial incision was made and extended transversely. The fascia was separated from the underlying rectus tissue superiorly and inferiorly. The peritoneum was identified and entered. Peritoneal incision was extended longitudinally. The utero-vesical peritoneal reflection was incised transversely and the bladder flap was bluntly freed from the lower uterine segment. A low transverse uterine incision was made. Delivered from cephalic presentation was a viable female infant.  Cord ph was not sent the umbilical cord was clamped and cut cord blood was obtained for evaluation. The placenta was removed Intact and and spontaneously, appeared normal. The uterine outline, tubes and ovaries appeared normal}. The uterus was then exteriorized and cleared of clot and debris.  The uterine incision was closed with running locked sutures of 0monocryl. An embrocating layer was then done.  Excellent Hemostasis was observed. The uterus was then placed back into the abdomen.  Continued hemostasis was noted and the fascia was then  reapproximated with running sutures of 0Vicryl. The subcuticular closure was performed using 2Vicryl. The skin was closed with 4-0Vicryl.   Instrument, sponge, and needle counts were correct prior the abdominal closure and were correct at the conclusion of the case.    Findings: normal tubes and ovaries, normal uterus; viable female, apgars 9/9; weight not yet done   Estimated Blood Loss: 853 mL   Total IV Fluids: 2900ml   Urine Output: 175CC OF clear urine  Specimens: @ORSPECIMEN @   Complications: no complications  Disposition: PACU - hemodynamically stable.   Maternal Condition: stable   Baby condition / location:  Couplet care / Skin to Skin  Attending Attestation: I was present and scrubbed for the entire procedure.   Signed: Surgeon(s): Amado NashAlmquist, Candace GallusSusan E, MD

## 2017-12-13 NOTE — Progress Notes (Signed)
Pt comfortable with epdiural  Temp:  [97.9 F (36.6 C)-98.9 F (37.2 C)] 98.9 F (37.2 C) (02/23 0030) Pulse Rate:  [69-105] 94 (02/23 0036) Resp:  [16-19] 19 (02/23 0031) BP: (124-165)/(64-88) 141/71 (02/23 0036)  One bp 165/90s, repeat within a couple of minutes and 141/41  A&x3 nml respirations Abd: soft, nt, gravid  cx: c/c/-2, small area of caput LE: 2+ edema, nt bilat  FHT: 150s, normal variability, period of decreased variability; +accels; +early decels, also few late decels - longest 3 and 4 min with drop to 120s; return to baseline and not repetative Toco: q 1-2 min  A/p: iup at 40.1 1. Arrest of descent - I have reviewed the above findings of minimal descent of fetal head and now with frequent early decelerations; also reviewed the late decelerations.  Recommend proceeding with c/s at this time an dpt and husband agree with plan.  Reviewed risks including bleeding/infection/injury to bowel, bladder, nerves, blood vessels, risk of further surgery, risk of anesthesia.  Consent signed. 2. Fetal status overall reassuring; category 2-3 3. gbs negative 4. Marginal CI

## 2017-12-14 NOTE — Lactation Note (Signed)
This note was copied from a baby's chart. Lactation Consultation Note Baby 24 hrs old. Parents state the baby had been quiet all day and now crying and wont stop. Discussed cluster feeding. Baby has painful latch. Mom can't tolerate. Baby bites hard. Not suckling on breast.  Mom has round heavy breast. Mom has a lot of non-pit edema to LE. Nipples very short shaft. Areola and nipples not compressible. Mom has #16 Ns. Fits great. Latched baby in football position. Mom tense and squilling from pain d/t biting. Assessed baby suck. Baby biting hard. Has good extention of long pointy tongue, good lateralization of tongue. Has thick upper labial frenulum. Baby needs lip flanged w/finger after latching for comfort at the breast. Worked and worked w/baby to suckle instead of biting. Baby finally started suckling, still needing frequent adjustment. Demonstrated to mom and FOB. Encouraged FOB to assist mom in flanging lips . Fitted mom w/#20 NS d/t baby biting. Mom stated helpful. Noted colostrum in NS. Mom excited. Encouraged to assess breast before and after feeding for transfer. Mom has been using DEBP. Discussed pumping and milk storage and hand expression. Mom wanting to give baby colostrum that is pumped or expressed after feeding. Baby has good out put. Noted uric crystals in urine. Small stool appears to may be starting to transition from meconium to dark brown, still having both.  Baby looks slightly jaundice.  Answered lots of questions, discussed newborn feeding habits and behaviors.  Patient Name: Girl Estelle JuneSaranah Nolton QMVHQ'IToday's Date: 12/14/2017 Reason for consult: Follow-up assessment;Mother's request;Difficult latch   Maternal Data    Feeding Feeding Type: Breast Fed Length of feed: 30 min(still BF)  LATCH Score Latch: Repeated attempts needed to sustain latch, nipple held in mouth throughout feeding, stimulation needed to elicit sucking reflex.  Audible Swallowing: A few with  stimulation  Type of Nipple: Everted at rest and after stimulation  Comfort (Breast/Nipple): Soft / non-tender  Hold (Positioning): Full assist, staff holds infant at breast  LATCH Score: 6  Interventions Interventions: Breast feeding basics reviewed;Assisted with latch;Breast compression;Skin to skin;Adjust position;Breast massage;Support pillows;Hand express;Position options;DEBP;Pre-pump if needed  Lactation Tools Discussed/Used Tools: Pump;Nipple Shields Nipple shield size: 16;20 Shell Type: Inverted Breast pump type: Double-Electric Breast Pump   Consult Status Consult Status: Follow-up Date: 12/14/17 Follow-up type: In-patient    Pedrohenrique Mcconville, Diamond NickelLAURA G 12/14/2017, 3:03 AM

## 2017-12-14 NOTE — Lactation Note (Signed)
This note was copied from a baby's chart. Lactation Consultation Note  Patient Name: Lisa Strickland ZOXWR'UToday's Date: 12/14/2017 Reason for consult: Follow-up assessment  Parents requested LC services for BF help, mom can't latch baby on due to sore nipples. Right nipple is sore, but left nipple already small scabs, per mom it was bleeding earlier when she tried to latch baby on with NS. Latching baby is too painful now even with NS. Mom tried to latch baby one more time using NS but she asked to break the latch because it was too painful. Mom wishes to pump and hand express instead while  her nipples heal.  Did some hand expression and obtained 1 ml of colostrum, and spoon fed baby. Mom has not been pumping consistently every 3 hours like she was told, she only pumped twice today. Explained to parents the importance of consistent pumping, mom will try to pump every 3 hours and also hand-express her colostrum to feed it back to baby. Parents aware of LC services and will call PRN.  Interventions Interventions: Breast feeding basics reviewed;Assisted with latch;Skin to skin;Breast massage;Hand express;Breast compression;Support pillows;Expressed milk;Position options;DEBP  Lactation Tools Discussed/Used Breast pump type: Double-Electric Breast Pump   Consult Status Consult Status: Follow-up Date: 12/15/17 Follow-up type: In-patient    Syre Knerr Venetia ConstableS Liona Wengert 12/14/2017, 4:45 PM

## 2017-12-14 NOTE — Plan of Care (Signed)
Patient has ambulated in room. Denies any pain or discomfort at this time. Patient is voiding without any difficulty. Fundus is firm and small bleeding noted on PeriPad.

## 2017-12-14 NOTE — Progress Notes (Addendum)
No c/o; ambulating; tol po; +flatus; light lochia  Patient Vitals for the past 24 hrs:  BP Temp Temp src Pulse Resp SpO2  12/14/17 0820 116/61 97.8 F (36.6 C) Oral 60 16 98 %  12/14/17 0425 120/72 97.8 F (36.6 C) Oral (!) 58 18 96 %  12/14/17 0055 121/66 98 F (36.7 C) Oral 66 18 98 %  12/13/17 2238 - - - - - 95 %  12/13/17 2016 117/60 98.4 F (36.9 C) Oral 67 18 97 %  12/13/17 1600 119/62 97.9 F (36.6 C) Oral 67 16 98 %    Intake/Output Summary (Last 24 hours) at 12/14/2017 1225 Last data filed at 12/14/2017 0820 Gross per 24 hour  Intake 118 ml  Output 600 ml  Net -482 ml     PE:  A&ox3 rrr ctab Abd: soft, nt, nd; +bs; fundus firm and 2cm below umb; dressing c/d/i LE:  2+ LE edema, nt    CBC Latest Ref Rng & Units 12/13/2017 12/12/2017 12/10/2017  WBC 4.0 - 10.5 K/uL 20.7(H) 11.5(H) 11.7(H)  Hemoglobin 12.0 - 15.0 g/dL 10.7(L) 11.3(L) 12.0  Hematocrit 36.0 - 46.0 % 31.7(L) 33.7(L) 35.6(L)  Platelets 150 - 400 K/uL 255 302 309   CMP Latest Ref Rng & Units 12/12/2017 12/10/2017  Glucose 65 - 99 mg/dL 83 79  BUN 6 - 20 mg/dL 11 14  Creatinine 1.610.44 - 1.00 mg/dL 0.960.66 0.450.68  Sodium 409135 - 145 mmol/L 132(L) 132(L)  Potassium 3.5 - 5.1 mmol/L 4.0 4.1  Chloride 101 - 111 mmol/L 104 103  CO2 22 - 32 mmol/L 20(L) 19(L)  Calcium 8.9 - 10.3 mg/dL 8.1(X8.7(L) 9.1(Y8.6(L)  Total Protein 6.5 - 8.1 g/dL 6.7 6.9  Total Bilirubin 0.3 - 1.2 mg/dL 0.4 0.5  Alkaline Phos 38 - 126 U/L 238(H) 247(H)  AST 15 - 41 U/L 20 21  ALT 14 - 54 U/L 11(L) 12(L)   A/P: pod 1 s/p 1ltcs 1. Doing well, contin current care 2. Mild acute anemia,asymptomatic: plan iron q day pp 3. Nursing, girl 4. Rh pos 5. Rubella immune

## 2017-12-15 MED ORDER — FERROUS SULFATE 325 (65 FE) MG PO TABS: 325.0000 mg | ORAL_TABLET | Freq: Every day | ORAL | 3 refills | Status: DC

## 2017-12-15 MED ORDER — SENNOSIDES-DOCUSATE SODIUM 8.6-50 MG PO TABS: 2.0000 | ORAL_TABLET | ORAL | 0 refills | Status: DC

## 2017-12-15 MED ORDER — IBUPROFEN 600 MG PO TABS: 600.0000 mg | ORAL_TABLET | Freq: Four times a day (QID) | ORAL | 0 refills | Status: DC

## 2017-12-15 MED ORDER — IBUPROFEN 600 MG PO TABS
600.0000 mg | ORAL_TABLET | Freq: Four times a day (QID) | ORAL | Status: DC
  Administered 2017-12-15 (×3): 600 mg via ORAL
  Filled 2017-12-15 (×3): qty 1

## 2017-12-15 NOTE — Discharge Summary (Signed)
Obstetric Discharge Summary   Patient Name: Lisa Strickland DOB: 1976/06/18 MRN: [id number]  Date of Admission: 12/12/2017 Date of Discharge: 12/15/2017 Date of Delivery: 12/13/2017 Gestational Age at Delivery: [redacted]w[redacted]d  Primary OB: [facility] - Dr. Ernestina Penna   Antepartum complications:  - AMA. Normal PGD. AFP/quad returned as a positive screen due to >1/100 risk based on age alone but quad screen reduced her risk compared to risk of age alone - Marginal cord insertion - High weight gain- 47# - GERD  Prenatal Labs:  ABO, Rh: --/--/B POS, B POS Antibody: NEG (02/22 0109) Rubella: Immune (07/27 0000) RPR: Nonreactive (07/27 0000)  HBsAg: Negative (07/27 0000)  HIV: Non-reactive (07/27 0000)  GBS: Negative (01/23 0000)  1 hr Glucola 87            Genetic screening normal PGD, quad lowers risk vs age alone Anatomy US normal  Admitting Diagnosis: IOL for AMA and Marginal cord insertion  Secondary Diagnoses: Patient Active Problem List   Diagnosis Date Noted  . Cesarean delivery delivered 12/14/2017  . Postpartum care following cesarean delivery (2/23) 12/13/2017  . Encounter for planned induction of labor 12/12/2017    Augmentation: Cytotec and Pitocin  Complications: None  Date of Delivery: 12/13/2017 Delivered By: Dr. Guinevere Scarlet. Fredric Mare, CNM assist  Delivery Type: primary cesarean section, low transverse incision  Newborn Data: Live born female  Birth Weight: 7 lb 11.1 oz (3490 g) APGAR: 9, 9  Newborn Delivery   Birth date/time:  12/13/2017 02:39:00 Delivery type:  C-Section, Low Transverse C-section categorization:  Primary     Hospital/Postpartum Course  (Cesarean Section):  Pt. Admitted for IOL for AMA and MCI.  She progressed to complete, but had arrest of descent at -2 station.  See operative note for further details.  Patient had an uncomplicated postpartum course.  By time of discharge on POD#2, her pain was controlled on oral pain medications; she had  appropriate lochia and was ambulating, voiding without difficulty, tolerating regular diet and passing flatus.   She was deemed stable for discharge to home.     Labs: CBC Latest Ref Rng & Units 12/13/2017 12/12/2017 12/10/2017  WBC 4.0 - 10.5 K/uL 20.7(H) 11.5(H) 11.7(H)  Hemoglobin 12.0 - 15.0 g/dL 10.7(L) 11.3(L) 12.0  Hematocrit 36.0 - 46.0 % 31.7(L) 33.7(L) 35.6(L)  Platelets 150 - 400 K/uL 255 302 309   Conflict (See Lab Report): B POS/B POS Performed at [facility], [address], [place], [place] [address]   Physical exam:  BP 131/72 (BP Location: Right Arm)   Pulse 68   Temp 97.8 F (36.6 C) (Oral)   Resp 18   Ht 5' 5.5" (1.664 m)   Wt 101.6 kg (224 lb)   SpO2 98%   Breastfeeding? Unknown   BMI 36.71 kg/m  General: alert and no distress Pulm: normal respiratory effort Lochia: appropriate Abdomen: soft, NT Uterine Fundus: firm, below umbilicus Perineum: healing well, no significant erythema, no significant edema Incision: c/d/i, healing well, no significant drainage, no dehiscence, no significant erythema Extremities: No evidence of DVT seen on physical exam. No lower extremity edema.   Disposition: stable, discharge to home Baby Feeding: breast milk and formula Baby Disposition: home with mom  Contraception: unsure, IVF pregnancy  Rh Immune globulin given: N/A Rubella vaccine given: N/A Tdap vaccine given in AP or PP setting: UTD Flu vaccine given in AP or PP setting: UTD   Plan:  Tedra Coppernoll was discharged to home in good condition. Follow-up appointment at [facility]  in 6 weeks.  Discharge Instructions: Per After Visit Summary. Refer to After Visit Summary and [facility] discharge booklet  Activity: Advance as tolerated. Pelvic rest for 6 weeks.   Diet: Regular, Heart Healthy Discharge Medications: Allergies as of 12/15/2017      Reactions   Sulfa Antibiotics Rash      Medication List    STOP taking these medications    cyclobenzaprine 10 MG tablet Commonly known as:  FLEXERIL     TAKE these medications   acetaminophen 500 MG tablet Commonly known as:  TYLENOL Take 500 mg by mouth every 6 (six) hours as needed for headache.   ferrous sulfate 325 (65 FE) MG tablet Take 1 tablet (325 mg total) by mouth daily.   ibuprofen 600 MG tablet Commonly known as:  ADVIL,MOTRIN Take 1 tablet (600 mg total) by mouth every 6 (six) hours.   senna-docusate 8.6-50 MG tablet Commonly known as:  Senokot-S Take 2 tablets by mouth daily. Start taking on:  12/16/2017            Discharge Care Instructions  (From admission, onward)        Start     Ordered   12/15/17 0000  Discharge wound care:    Comments:  Remove honeycomb dressing on postpartum day 5 (Thursday); keep incision clean, dry; steri-strips will fall off in about 1 week   12/15/17 1043     Outpatient follow up:  Follow-up Information    Noland FordyceFogleman, Kelly, MD. Schedule an appointment as soon as possible for a visit in 6 week(s).   Specialty:  Obstetrics and Gynecology Why:  Postpartum visit  Contact information: [address] [place] [address] [phone]           Signed:  Carlean JewsMeredith Sigmon, MSN, CNM [facility]

## 2017-12-15 NOTE — Progress Notes (Signed)
POSTOPERATIVE DAY # 2 S/P Primary LTCS for Arrest of descent; NRFHR, baby girl    S:         Reports feeling well, some incisional pain, but would like to be discharged today             Tolerating po intake / no nausea / no vomiting / + flatus / no BM  Denies dizziness, SOB, or CP             Bleeding is light             Pain controlled withMotrin             Up ad lib / ambulatory/ voiding QS  Newborn breast feeding with some formula supplementation   O:  VS: BP 131/72 (BP Location: Right Arm)   Pulse 68   Temp 97.8 F (36.6 C) (Oral)   Resp 18   Ht 5' 5.5" (1.664 m)   Wt 101.6 kg (224 lb)   SpO2 98%   Breastfeeding? Unknown   BMI 36.71 kg/m    LABS:               Recent Labs    12/13/17 0717  WBC 20.7*  HGB 10.7*  PLT 255               Bloodtype: --/--/B POS, B POS Performed at [facility], [address] [place], [place] [address]  [phone](02/22 0109)  Rubella: Immune (07/27 0000)                                             I&O: Intake/Output      02/24 0701 - 02/25 0700 02/25 0701 - 02/26 0700   P.O. 476    Total Intake(mL/kg) 476 (4.7)    Urine (mL/kg/hr)     Total Output     Net +476         Urine Occurrence 4 x                 Physical Exam:             Alert and Oriented X3  Lungs: Clear and unlabored  Heart: regular rate and rhythm / no murmurs  Abdomen: soft, non-tender, non-distended, hypoactive bowel sounds in all quadrants              Fundus: firm, non-tender, U-1             Dressing: honeycomb with steri-strips c/d/i              Incision:  approximated with sutures / no erythema / no ecchymosis / no drainage  Perineum: intact  Lochia: small, no clots  Extremities: +1-+2 BLE edema, no calf pain or tenderness  A:        POD # 2 S/P Primary LTCS for arrest of descent; NRFHR            Mild ABL Anemia - stable on oral FE  Dependent LE edema   P:        Routine postoperative care              Discharge home today   [facility] discharge book  and instructions reviewed   Encouraged to increase hydration and elevate feet for edema   F/u with Dr. Ernestina PennaFogleman in 6 weeks   Carlean JewsMeredith Harmonie Verrastro, MSN,  CNM [facility]

## 2017-12-15 NOTE — Progress Notes (Signed)
Patient asked for scheduled ibuprofen, which had been discontinued. Spoke with Marlinda Mikeanya Bailey, the midwife, who gave verbal orders to restart the scheduled ibuprofen order of 600 mg every 6 hours.

## 2017-12-15 NOTE — Lactation Note (Signed)
This note was copied from a baby's chart. Lactation Consultation Note  Patient Name: Lisa Strickland ZOXWR'UToday's Date: 12/15/2017 Reason for consult: Follow-up assessment   Baby 54 hours.  IVF baby.  Mother concerned about her milk supply. Reviewed hand expression with good flow of colostrum from R breast. L breast cracked and has bled and mother is too uncomfortable to express. Spoon feed baby approx 3 ml of colostrum. Assisted w/ latching baby in football hold on R breast for 10 min. Parents will supplement w/ formula using curved tip syringe after. Reviewed volume guidelines. Suggest breastfeeding before giving formula if mother can tolerate. Recommend mother post pump 4-6 times per day for 10-20 min with DEBP on initiation setting. Give baby back volume pumped at the next feeding. Reviewed milk storage.  Mom encouraged to feed baby 8-12 times/24 hours and with feeding cues.  Suggest alternating with shells/coconut oil,ebm and comfort gels. Reviewed engorgement care and monitoring voids/stools.     Maternal Data Has patient been taught Hand Expression?: Yes  Feeding Feeding Type: Breast Fed Length of feed: 10 min  LATCH Score Latch: Grasps breast easily, tongue down, lips flanged, rhythmical sucking.  Audible Swallowing: A few with stimulation  Type of Nipple: Everted at rest and after stimulation  Comfort (Breast/Nipple): Engorged, cracked, bleeding, large blisters, severe discomfort  Hold (Positioning): Assistance needed to correctly position infant at breast and maintain latch.  LATCH Score: 6  Interventions Interventions: Breast feeding basics reviewed;Assisted with latch;Breast massage;Hand express;Breast compression;Support pillows;Expressed milk;Comfort gels;Shells;Coconut oil;DEBP  Lactation Tools Discussed/Used Breast pump type: Double-Electric Breast Pump   Consult Status Consult Status: Complete    Hardie PulleyBerkelhammer, Ruth Boschen 12/15/2017, 10:14  AM

## 2017-12-17 ENCOUNTER — Encounter (HOSPITAL_COMMUNITY): Payer: Self-pay

## 2019-10-25 LAB — OB RESULTS CONSOLE ABO/RH: RH Type: POSITIVE

## 2019-10-25 LAB — OB RESULTS CONSOLE GC/CHLAMYDIA
Chlamydia: NEGATIVE
Gonorrhea: NEGATIVE

## 2019-10-25 LAB — OB RESULTS CONSOLE ANTIBODY SCREEN: Antibody Screen: NEGATIVE

## 2019-10-25 LAB — OB RESULTS CONSOLE RUBELLA ANTIBODY, IGM: Rubella: IMMUNE

## 2019-10-25 LAB — OB RESULTS CONSOLE RPR: RPR: NONREACTIVE

## 2019-10-25 LAB — OB RESULTS CONSOLE HEPATITIS B SURFACE ANTIGEN: Hepatitis B Surface Ag: NEGATIVE

## 2019-10-25 LAB — OB RESULTS CONSOLE HIV ANTIBODY (ROUTINE TESTING): HIV: NONREACTIVE

## 2020-01-13 ENCOUNTER — Ambulatory Visit: Payer: Self-pay | Attending: Internal Medicine

## 2020-04-18 ENCOUNTER — Other Ambulatory Visit: Payer: Self-pay | Admitting: Obstetrics

## 2020-04-20 LAB — OB RESULTS CONSOLE GBS: GBS: NEGATIVE

## 2020-05-05 ENCOUNTER — Telehealth (HOSPITAL_COMMUNITY): Payer: Self-pay | Admitting: *Deleted

## 2020-05-05 ENCOUNTER — Encounter (HOSPITAL_COMMUNITY): Payer: Self-pay | Admitting: *Deleted

## 2020-05-05 NOTE — Telephone Encounter (Signed)
Preadmission screen  

## 2020-05-05 NOTE — Patient Instructions (Signed)
Lisa Strickland  05/05/2020   Your procedure is scheduled on:  05/18/2020  Arrive at 0530 at Entrance C on [address] at [facility]  and [employer]. You are invited to use the FREE valet parking or use the Visitor's parking deck.  Pick up the phone at the desk and dial [phone].  Call this number if you have problems the morning of surgery: [phone]  Remember:   Do not eat food:(After Midnight) Desps de medianoche.  Do not drink clear liquids: (After Midnight) Desps de medianoche.  Take these medicines the morning of surgery with A SIP OF WATER:  none   Do not wear jewelry, make-up or nail polish.  Do not wear lotions, powders, or perfumes. Do not wear deodorant.  Do not shave 48 hours prior to surgery.  Do not bring valuables to the hospital.  [facility] is not   responsible for any belongings or valuables brought to the hospital.  Contacts, dentures or bridgework may not be worn into surgery.  Leave suitcase in the car. After surgery it may be brought to your room.  For patients admitted to the hospital, checkout time is 11:00 AM the day of              discharge.      Please read over the following fact sheets that you were given:     Preparing for Surgery

## 2020-05-08 ENCOUNTER — Telehealth (HOSPITAL_COMMUNITY): Payer: Self-pay | Admitting: *Deleted

## 2020-05-08 NOTE — Telephone Encounter (Signed)
Preadmission screen  

## 2020-05-09 ENCOUNTER — Encounter (HOSPITAL_COMMUNITY): Payer: Self-pay

## 2020-05-16 ENCOUNTER — Other Ambulatory Visit: Payer: Self-pay

## 2020-05-16 ENCOUNTER — Other Ambulatory Visit (HOSPITAL_COMMUNITY)
Admission: RE | Admit: 2020-05-16 | Discharge: 2020-05-16 | Disposition: A | Discharge status: Home / Self Care | Payer: Managed Care, Other (non HMO) | Source: Ambulatory Visit | Attending: Obstetrics | Admitting: Obstetrics

## 2020-05-16 DIAGNOSIS — Z01812 Encounter for preprocedural laboratory examination: Secondary | ICD-10-CM | POA: Insufficient documentation

## 2020-05-16 DIAGNOSIS — Z20822 Contact with and (suspected) exposure to covid-19: Secondary | ICD-10-CM | POA: Insufficient documentation

## 2020-05-16 HISTORY — DX: Unspecified abnormal cytological findings in specimens from vagina: R87.629

## 2020-05-16 LAB — CBC
HCT: 38.3 % (ref 36.0–46.0)
Hemoglobin: 12.9 g/dL (ref 12.0–15.0)
MCH: 32.3 pg (ref 26.0–34.0)
MCHC: 33.7 g/dL (ref 30.0–36.0)
MCV: 96 fL (ref 80.0–100.0)
Platelets: 243 10*3/uL (ref 150–400)
RBC: 3.99 MIL/uL (ref 3.87–5.11)
RDW: 18.5 % — ABNORMAL HIGH (ref 11.5–15.5)
WBC: 10.8 10*3/uL — ABNORMAL HIGH (ref 4.0–10.5)
nRBC: 0 % (ref 0.0–0.2)

## 2020-05-16 LAB — COMPREHENSIVE METABOLIC PANEL
ALT: 14 U/L (ref 0–44)
AST: 20 U/L (ref 15–41)
Albumin: 2.7 g/dL — ABNORMAL LOW (ref 3.5–5.0)
Alkaline Phosphatase: 136 U/L — ABNORMAL HIGH (ref 38–126)
Anion gap: 10 (ref 5–15)
BUN: 7 mg/dL (ref 6–20)
CO2: 20 mmol/L — ABNORMAL LOW (ref 22–32)
Calcium: 8.7 mg/dL — ABNORMAL LOW (ref 8.9–10.3)
Chloride: 105 mmol/L (ref 98–111)
Creatinine, Ser: 0.54 mg/dL (ref 0.44–1.00)
GFR calc Af Amer: >60 mL/min (ref >60)
GFR calc non Af Amer: >60 mL/min (ref >60)
Glucose, Bld: 97 mg/dL (ref 70–99)
Potassium: 3.6 mmol/L (ref 3.5–5.1)
Sodium: 135 mmol/L (ref 135–145)
Total Bilirubin: 0.5 mg/dL (ref 0.3–1.2)
Total Protein: 5.7 g/dL — ABNORMAL LOW (ref 6.5–8.1)

## 2020-05-16 LAB — TYPE AND SCREEN
ABO/RH(D): B POS
Antibody Screen: NEGATIVE

## 2020-05-16 LAB — SARS CORONAVIRUS 2 (TAT 6-24 HRS): SARS Coronavirus 2: NEGATIVE

## 2020-05-16 NOTE — MAU Note (Signed)
Asymptomatic, swab collected.  Have spoke with lab, to lobby to wait

## 2020-05-17 LAB — RPR: RPR Ser Ql: NONREACTIVE

## 2020-05-17 NOTE — H&P (Signed)
Lisa Strickland is a 44 y.o. [id number] at [redacted]w[redacted]d presenting for repeat cesarean section with bilateral salpingectomy. Pt notes occasional contractions. Good fetal movement, No vaginal bleeding, not leaking fluid.  [facility] at [facility] since first trimester -Dated by IVF. -Advanced maternal age.  Normal preimplantation genetic testing -49 pound weight gain -Undesired fertility.  Plan bilateral salpingectomy -Polyhydramnios   Prenatal Transfer Tool  Maternal Diabetes: No Genetic Screening: Normal Maternal Ultrasounds/Referrals: Normal Fetal Ultrasounds or other Referrals:  None Maternal Substance Abuse:  No Significant Maternal Medications:  None Significant Maternal Lab Results: Group B Strep negative     OB History     Gravida  4   Para  1   Term  1   Preterm      AB  2   Living  1      SAB  2   TAB      Ectopic      Multiple  0   Live Births  1          Past Medical History:  Diagnosis Date   Medical history non-contributory    Vaginal Pap smear, abnormal    Past Surgical History:  Procedure Laterality Date   BUNIONECTOMY     CESAREAN SECTION N/A 12/13/2017   Procedure: CESAREAN SECTION;  Surgeon: Vick Frees, MD;  Location: [facility] BIRTHING SUITES;  Service: Obstetrics;  Laterality: N/A;   CYSTECTOMY     GYNECOLOGIC CRYOSURGERY     Family History: family history includes Breast cancer in her paternal aunt; Colon cancer in her paternal aunt; Thrombosis in her father. Social History:  reports that she has never smoked. She has never used smokeless tobacco. She reports that she does not drink alcohol and does not use drugs.  Review of Systems - Negative except Discomfort of pregnancy     unknown if currently breastfeeding.  Physical Exam:  Gen: well appearing, no distress CV: RRR Pulm: CTAB Back: no CVAT Abd: gravid, NT, no RUQ pain  Prenatal labs: ABO, Rh: --/--/B POS (07/27 1009) Antibody: NEG (07/27 1009) Rubella: Immune (01/04  0000) RPR: NON REACTIVE (07/27 1009)  HBsAg: Negative (01/04 0000)  HIV: Non-reactive (01/04 0000)  GBS: Negative/-- (07/01 0000)  1 hr Glucola 127  Genetic screening normal preimplantation genetic diagnosis, normal quad Anatomy US normal   Assessment/Plan: 44 y.o. [id number] at [redacted]w[redacted]d -Repeat cesarean section.  Risk benefits discussed with patient. -Undesired fertility.  Plan bilateral salpingectomy   Lendon Colonel 05/17/2020, 5:07 PM

## 2020-05-17 NOTE — Anesthesia Preprocedure Evaluation (Addendum)
Anesthesia Evaluation  Patient identified by MRN, date of birth, ID band Patient awake    Reviewed: Allergy & Precautions, NPO status , Patient's Chart, lab work & pertinent test results  Airway Mallampati: I  TM Distance: >3 FB Neck ROM: Full    Dental no notable dental hx. (+) Teeth Intact, Dental Advisory Given, Caps,    Pulmonary neg pulmonary ROS,    Pulmonary exam normal breath sounds clear to auscultation       Cardiovascular negative cardio ROS Normal cardiovascular exam Rhythm:Regular Rate:Normal     Neuro/Psych negative neurological ROS  negative psych ROS   GI/Hepatic Neg liver ROS, GERD  ,  Endo/Other  negative endocrine ROSMorbid obesityObesity  Renal/GU negative Renal ROS  negative genitourinary   Musculoskeletal negative musculoskeletal ROS (+)   Abdominal (+) + obese,   Peds negative pediatric ROS (+)  Hematology negative hematology ROS (+)   Anesthesia Other Findings   Reproductive/Obstetrics (+) Pregnancy                            Anesthesia Physical  Anesthesia Plan  ASA: II  Anesthesia Plan: Spinal   Post-op Pain Management:    Induction:   PONV Risk Score and Plan: Scopolamine patch - Pre-op  Airway Management Planned: Natural Airway  Additional Equipment:   Intra-op Plan:   Post-operative Plan:   Informed Consent: I have reviewed the patients History and Physical, chart, labs and discussed the procedure including the risks, benefits and alternatives for the proposed anesthesia with the patient or authorized representative who has indicated his/her understanding and acceptance.       Plan Discussed with: Anesthesiologist and CRNA  Anesthesia Plan Comments: (  )       Anesthesia Quick Evaluation

## 2020-05-18 ENCOUNTER — Encounter (HOSPITAL_COMMUNITY): Payer: Self-pay | Admitting: Obstetrics

## 2020-05-18 ENCOUNTER — Inpatient Hospital Stay (HOSPITAL_COMMUNITY)
Admission: RE | Admit: 2020-05-18 | Payer: Managed Care, Other (non HMO) | Source: Home / Self Care | Admitting: Obstetrics

## 2020-05-18 ENCOUNTER — Other Ambulatory Visit: Payer: Self-pay

## 2020-05-18 ENCOUNTER — Inpatient Hospital Stay (HOSPITAL_COMMUNITY): Payer: Managed Care, Other (non HMO) | Admitting: Anesthesiology

## 2020-05-18 ENCOUNTER — Encounter (HOSPITAL_COMMUNITY)
Admission: RE | Disposition: A | Discharge status: Home / Self Care | Payer: Self-pay | Source: Home / Self Care | Attending: Obstetrics

## 2020-05-18 ENCOUNTER — Inpatient Hospital Stay (HOSPITAL_COMMUNITY)
Admission: RE | Admit: 2020-05-18 | Discharge: 2020-05-20 | DRG: 785 | Disposition: A | Discharge status: Home / Self Care | Payer: Managed Care, Other (non HMO) | Attending: Obstetrics | Admitting: Obstetrics

## 2020-05-18 DIAGNOSIS — Z20822 Contact with and (suspected) exposure to covid-19: Secondary | ICD-10-CM | POA: Diagnosis present

## 2020-05-18 DIAGNOSIS — Z3A39 39 weeks gestation of pregnancy: Secondary | ICD-10-CM

## 2020-05-18 DIAGNOSIS — Z98891 History of uterine scar from previous surgery: Secondary | ICD-10-CM

## 2020-05-18 DIAGNOSIS — Z9851 Tubal ligation status: Secondary | ICD-10-CM

## 2020-05-18 DIAGNOSIS — O34211 Maternal care for low transverse scar from previous cesarean delivery: Principal | ICD-10-CM | POA: Diagnosis present

## 2020-05-18 DIAGNOSIS — Z302 Encounter for sterilization: Secondary | ICD-10-CM

## 2020-05-18 DIAGNOSIS — O403XX0 Polyhydramnios, third trimester, not applicable or unspecified: Secondary | ICD-10-CM | POA: Diagnosis present

## 2020-05-18 DIAGNOSIS — Z3490 Encounter for supervision of normal pregnancy, unspecified, unspecified trimester: Secondary | ICD-10-CM

## 2020-05-18 HISTORY — DX: History of uterine scar from previous surgery: Z98.891

## 2020-05-18 SURGERY — Surgical Case
Anesthesia: Spinal | Laterality: Bilateral | Wound class: Clean Contaminated

## 2020-05-18 MED ORDER — ONDANSETRON HCL 4 MG/2ML IJ SOLN: 4.0000 mg | Freq: Once | INTRAMUSCULAR | Status: DC | PRN

## 2020-05-18 MED ORDER — DIBUCAINE (PERIANAL) 1 % EX OINT: 1.0000 "application " | TOPICAL_OINTMENT | CUTANEOUS | Status: DC | PRN

## 2020-05-18 MED ORDER — PHENYLEPHRINE HCL-NACL 20-0.9 MG/250ML-% IV SOLN
INTRAVENOUS | Status: DC | PRN
  Administered 2020-05-18: 60 ug/min via INTRAVENOUS

## 2020-05-18 MED ORDER — MORPHINE SULFATE (PF) 0.5 MG/ML IJ SOLN
INTRAMUSCULAR | Status: DC | PRN
  Administered 2020-05-18: 150 ug via INTRATHECAL

## 2020-05-18 MED ORDER — ONDANSETRON HCL 4 MG PO TABS: 4.0000 mg | ORAL_TABLET | Freq: Four times a day (QID) | ORAL | Status: DC | PRN

## 2020-05-18 MED ORDER — TETANUS-DIPHTH-ACELL PERTUSSIS 5-2.5-18.5 LF-MCG/0.5 IM SUSP: 0.5000 mL | Freq: Once | INTRAMUSCULAR | Status: DC

## 2020-05-18 MED ORDER — SIMETHICONE 80 MG PO CHEW
80.0000 mg | CHEWABLE_TABLET | Freq: Three times a day (TID) | ORAL | Status: DC
  Administered 2020-05-18 – 2020-05-20 (×4): 80 mg via ORAL
  Filled 2020-05-18 (×5): qty 1

## 2020-05-18 MED ORDER — OXYTOCIN-SODIUM CHLORIDE 30-0.9 UT/500ML-% IV SOLN
INTRAVENOUS | Status: DC | PRN
Start: 2020-05-18 — End: 2020-05-18
  Administered 2020-05-18 (×2): 30 [IU] via INTRAVENOUS

## 2020-05-18 MED ORDER — PHENYLEPHRINE 40 MCG/ML (10ML) SYRINGE FOR IV PUSH (FOR BLOOD PRESSURE SUPPORT)
PREFILLED_SYRINGE | INTRAVENOUS | Status: AC
  Filled 2020-05-18: qty 10

## 2020-05-18 MED ORDER — STERILE WATER FOR IRRIGATION IR SOLN
Status: DC | PRN
  Administered 2020-05-18: 1000 mL

## 2020-05-18 MED ORDER — SODIUM CHLORIDE 0.9 % IR SOLN
Status: DC | PRN
  Administered 2020-05-18: 1000 mL

## 2020-05-18 MED ORDER — LACTATED RINGERS IV SOLN: INTRAVENOUS | Status: DC

## 2020-05-18 MED ORDER — ONDANSETRON HCL 4 MG/2ML IJ SOLN
INTRAMUSCULAR | Status: AC
  Filled 2020-05-18: qty 2

## 2020-05-18 MED ORDER — SIMETHICONE 80 MG PO CHEW: 80.0000 mg | CHEWABLE_TABLET | ORAL | Status: DC | PRN

## 2020-05-18 MED ORDER — MENTHOL 3 MG MT LOZG: 1.0000 | LOZENGE | OROMUCOSAL | Status: DC | PRN

## 2020-05-18 MED ORDER — OXYTOCIN-SODIUM CHLORIDE 30-0.9 UT/500ML-% IV SOLN
INTRAVENOUS | Status: AC
  Filled 2020-05-18: qty 500

## 2020-05-18 MED ORDER — FENTANYL CITRATE (PF) 100 MCG/2ML IJ SOLN
INTRAMUSCULAR | Status: DC | PRN
  Administered 2020-05-18: 15 ug via INTRATHECAL

## 2020-05-18 MED ORDER — CEFAZOLIN SODIUM-DEXTROSE 2-4 GM/100ML-% IV SOLN: 2.0000 g | INTRAVENOUS | Status: DC

## 2020-05-18 MED ORDER — CEFAZOLIN SODIUM-DEXTROSE 2-4 GM/100ML-% IV SOLN
2.0000 g | Freq: Once | INTRAVENOUS | Status: AC
  Administered 2020-05-18: 2 g via INTRAVENOUS

## 2020-05-18 MED ORDER — MEPERIDINE HCL 25 MG/ML IJ SOLN: 6.2500 mg | INTRAMUSCULAR | Status: DC | PRN

## 2020-05-18 MED ORDER — OXYCODONE HCL 5 MG PO TABS
5.0000 mg | ORAL_TABLET | ORAL | Status: DC | PRN
  Administered 2020-05-19 – 2020-05-20 (×2): 5 mg via ORAL
  Filled 2020-05-18 (×2): qty 1
  Filled 2020-05-18: qty 2
  Filled 2020-05-18: qty 1

## 2020-05-18 MED ORDER — OXYCODONE HCL 5 MG PO TABS: 5.0000 mg | ORAL_TABLET | Freq: Once | ORAL | Status: DC | PRN

## 2020-05-18 MED ORDER — PHENYLEPHRINE HCL (PRESSORS) 10 MG/ML IV SOLN
INTRAVENOUS | Status: DC | PRN
  Administered 2020-05-18 (×3): 40 ug via INTRAVENOUS

## 2020-05-18 MED ORDER — OXYTOCIN-SODIUM CHLORIDE 30-0.9 UT/500ML-% IV SOLN: 2.5000 [IU]/h | INTRAVENOUS | Status: AC

## 2020-05-18 MED ORDER — FENTANYL CITRATE (PF) 100 MCG/2ML IJ SOLN
INTRAMUSCULAR | Status: AC
  Filled 2020-05-18: qty 2

## 2020-05-18 MED ORDER — OXYCODONE HCL 5 MG/5ML PO SOLN: 5.0000 mg | Freq: Once | ORAL | Status: DC | PRN

## 2020-05-18 MED ORDER — GUAIFENESIN 100 MG/5ML PO SOLN
15.0000 mL | ORAL | Status: DC | PRN
  Filled 2020-05-18: qty 15

## 2020-05-18 MED ORDER — PHENYLEPHRINE HCL-NACL 20-0.9 MG/250ML-% IV SOLN
INTRAVENOUS | Status: AC
  Filled 2020-05-18: qty 500

## 2020-05-18 MED ORDER — ONDANSETRON HCL 4 MG/2ML IJ SOLN: 4.0000 mg | Freq: Four times a day (QID) | INTRAMUSCULAR | Status: DC | PRN

## 2020-05-18 MED ORDER — POVIDONE-IODINE 10 % EX SWAB: 2.0000 "application " | Freq: Once | CUTANEOUS | Status: DC

## 2020-05-18 MED ORDER — MENTHOL 3 MG MT LOZG
1.0000 | LOZENGE | OROMUCOSAL | Status: DC | PRN
  Filled 2020-05-18: qty 9

## 2020-05-18 MED ORDER — SENNOSIDES-DOCUSATE SODIUM 8.6-50 MG PO TABS
2.0000 | ORAL_TABLET | ORAL | Status: DC
  Administered 2020-05-19 (×2): 2 via ORAL
  Filled 2020-05-18 (×2): qty 2

## 2020-05-18 MED ORDER — CEFAZOLIN SODIUM-DEXTROSE 2-4 GM/100ML-% IV SOLN
INTRAVENOUS | Status: AC
  Filled 2020-05-18: qty 100

## 2020-05-18 MED ORDER — DEXAMETHASONE SODIUM PHOSPHATE 4 MG/ML IJ SOLN
INTRAMUSCULAR | Status: AC
  Filled 2020-05-18: qty 1

## 2020-05-18 MED ORDER — DIPHENHYDRAMINE HCL 25 MG PO CAPS: 25.0000 mg | ORAL_CAPSULE | Freq: Four times a day (QID) | ORAL | Status: DC | PRN

## 2020-05-18 MED ORDER — BUPIVACAINE IN DEXTROSE 0.75-8.25 % IT SOLN
INTRATHECAL | Status: DC | PRN
  Administered 2020-05-18: 1.6 mL via INTRATHECAL

## 2020-05-18 MED ORDER — DEXAMETHASONE SODIUM PHOSPHATE 4 MG/ML IJ SOLN
INTRAMUSCULAR | Status: DC | PRN
  Administered 2020-05-18: 4 mg via INTRAVENOUS

## 2020-05-18 MED ORDER — ZOLPIDEM TARTRATE 5 MG PO TABS: 5.0000 mg | ORAL_TABLET | Freq: Every evening | ORAL | Status: DC | PRN

## 2020-05-18 MED ORDER — ONDANSETRON HCL 4 MG/2ML IJ SOLN
INTRAMUSCULAR | Status: DC | PRN
  Administered 2020-05-18: 4 mg via INTRAVENOUS

## 2020-05-18 MED ORDER — SIMETHICONE 80 MG PO CHEW
80.0000 mg | CHEWABLE_TABLET | ORAL | Status: DC
  Administered 2020-05-19 (×2): 80 mg via ORAL
  Filled 2020-05-18 (×2): qty 1

## 2020-05-18 MED ORDER — MORPHINE SULFATE (PF) 0.5 MG/ML IJ SOLN
INTRAMUSCULAR | Status: AC
  Filled 2020-05-18: qty 10

## 2020-05-18 MED ORDER — WITCH HAZEL-GLYCERIN EX PADS: 1.0000 "application " | MEDICATED_PAD | CUTANEOUS | Status: DC | PRN

## 2020-05-18 MED ORDER — COCONUT OIL OIL
1.0000 "application " | TOPICAL_OIL | Status: DC | PRN
  Administered 2020-05-19: 1 via TOPICAL

## 2020-05-18 MED ORDER — PRENATAL MULTIVITAMIN CH
1.0000 | ORAL_TABLET | Freq: Every day | ORAL | Status: DC
  Administered 2020-05-19 – 2020-05-20 (×2): 1 via ORAL
  Filled 2020-05-18 (×2): qty 1

## 2020-05-18 MED ORDER — ACETAMINOPHEN 160 MG/5ML PO SOLN: 325.0000 mg | ORAL | Status: DC | PRN

## 2020-05-18 MED ORDER — FENTANYL CITRATE (PF) 100 MCG/2ML IJ SOLN: 25.0000 ug | INTRAMUSCULAR | Status: DC | PRN

## 2020-05-18 MED ORDER — IBUPROFEN 800 MG PO TABS
800.0000 mg | ORAL_TABLET | Freq: Three times a day (TID) | ORAL | Status: DC
  Administered 2020-05-18 – 2020-05-20 (×7): 800 mg via ORAL
  Filled 2020-05-18 (×7): qty 1

## 2020-05-18 MED ORDER — ACETAMINOPHEN 325 MG PO TABS: 325.0000 mg | ORAL_TABLET | ORAL | Status: DC | PRN

## 2020-05-18 SURGICAL SUPPLY — 34 items
BENZOIN TINCTURE PRP APPL 2/3 (GAUZE/BANDAGES/DRESSINGS) ×3 IMPLANT
CLAMP CORD UMBIL (MISCELLANEOUS) IMPLANT
CLOSURE WOUND 1/2 X4 (GAUZE/BANDAGES/DRESSINGS) ×1
CLOTH BEACON ORANGE TIMEOUT ST (SAFETY) ×3 IMPLANT
DRSG OPSITE POSTOP 4X10 (GAUZE/BANDAGES/DRESSINGS) ×3 IMPLANT
ELECT REM PT RETURN 9FT ADLT (ELECTROSURGICAL) ×3
ELECTRODE REM PT RTRN 9FT ADLT (ELECTROSURGICAL) ×1 IMPLANT
EXTRACTOR VACUUM M CUP 4 TUBE (SUCTIONS) IMPLANT
EXTRACTOR VACUUM M CUP 4' TUBE (SUCTIONS)
GLOVE BIO SURGEON STRL SZ 6.5 (GLOVE) ×2 IMPLANT
GLOVE BIO SURGEONS STRL SZ 6.5 (GLOVE) ×1
GLOVE BIOGEL PI IND STRL 7.0 (GLOVE) ×2 IMPLANT
GLOVE BIOGEL PI INDICATOR 7.0 (GLOVE) ×4
GOWN STRL REUS W/TWL LRG LVL3 (GOWN DISPOSABLE) ×6 IMPLANT
KIT ABG SYR 3ML LUER SLIP (SYRINGE) IMPLANT
NEEDLE HYPO 22GX1.5 SAFETY (NEEDLE) IMPLANT
NEEDLE HYPO 25X5/8 SAFETYGLIDE (NEEDLE) IMPLANT
NS IRRIG 1000ML POUR BTL (IV SOLUTION) ×3 IMPLANT
PACK C SECTION WH (CUSTOM PROCEDURE TRAY) ×3 IMPLANT
PAD OB MATERNITY 4.3X12.25 (PERSONAL CARE ITEMS) ×3 IMPLANT
PENCIL SMOKE EVAC W/HOLSTER (ELECTROSURGICAL) ×3 IMPLANT
STRIP CLOSURE SKIN 1/2X4 (GAUZE/BANDAGES/DRESSINGS) ×2 IMPLANT
SUT MON AB 4-0 PS1 27 (SUTURE) ×3 IMPLANT
SUT PLAIN 0 NONE (SUTURE) IMPLANT
SUT PLAIN 2 0 XLH (SUTURE) ×3 IMPLANT
SUT VIC AB 0 CT1 36 (SUTURE) ×6 IMPLANT
SUT VIC AB 0 CTX 36 (SUTURE) ×6
SUT VIC AB 0 CTX36XBRD ANBCTRL (SUTURE) ×3 IMPLANT
SUT VIC AB 2-0 CT1 27 (SUTURE) ×2
SUT VIC AB 2-0 CT1 TAPERPNT 27 (SUTURE) ×1 IMPLANT
SYR CONTROL 10ML LL (SYRINGE) IMPLANT
TOWEL OR 17X24 6PK STRL BLUE (TOWEL DISPOSABLE) ×3 IMPLANT
TRAY FOLEY W/BAG SLVR 14FR LF (SET/KITS/TRAYS/PACK) IMPLANT
WATER STERILE IRR 1000ML POUR (IV SOLUTION) ×3 IMPLANT

## 2020-05-18 NOTE — H&P (Signed)
Lisa Strickland is a 44 y.o. [id number] at [redacted]w[redacted]d presenting for repeat cesarean section with bilateral salpingectomy. Pt notes occasional contractions. Good fetal movement, No vaginal bleeding, not leaking fluid.  [facility]are at [facility] since first trimester -Dated by IVF. -Advanced maternal age.  Normal preimplantation genetic testing -49 pound weight gain -Undesired fertility.  Plan bilateral salpingectomy -Polyhydramnios   Prenatal Transfer Tool  Maternal Diabetes: No Genetic Screening: Normal Maternal Ultrasounds/Referrals: Normal Fetal Ultrasounds or other Referrals:  None Maternal Substance Abuse:  No Significant Maternal Medications:  None Significant Maternal Lab Results: Group B Strep negative     OB History    Gravida  4   Para  1   Term  1   Preterm      AB  2   Living  1     SAB  2   TAB      Ectopic      Multiple  0   Live Births  1          Past Medical History:  Diagnosis Date  . Medical history non-contributory   . Vaginal Pap smear, abnormal    Past Surgical History:  Procedure Laterality Date  . BUNIONECTOMY    . CESAREAN SECTION N/A 12/13/2017   Procedure: CESAREAN SECTION;  Surgeon: Vick Frees, MD;  Location: [facility] BIRTHING SUITES;  Service: Obstetrics;  Laterality: N/A;  . CYSTECTOMY    . GYNECOLOGIC CRYOSURGERY     Family History: family history includes Breast cancer in her paternal aunt; Colon cancer in her paternal aunt; Thrombosis in her father. Social History:  reports that she has never smoked. She has never used smokeless tobacco. She reports that she does not drink alcohol and does not use drugs.  Review of Systems - Negative except Discomfort of pregnancy     Blood pressure 122/85, pulse 88, temperature 97.7 F (36.5 C), resp. rate 18, height 5\' 5"  (1.651 m), weight (!) 97.1 kg, SpO2 100 %, currently breastfeeding.  Physical Exam:  Gen: well appearing, no distress  Back: no CVAT Abd: gravid, NT, no RUQ  pain LE: no edema, equal bilaterally, non-tender   Prenatal labs: ABO, Rh: --/--/B POS (07/27 1009) Antibody: NEG (07/27 1009) Rubella: Immune (01/04 0000) RPR: NON REACTIVE (07/27 1009)  HBsAg: Negative (01/04 0000)  HIV: Non-reactive (01/04 0000)  GBS: Negative/-- (07/01 0000)  1 hr Glucola 127  Genetic screening normal preimplantation genetic diagnosis, normal quad Anatomy 12-26-2005 normal  CBC    Component Value Date/Time   WBC 10.8 (H) 05/16/2020 1009   RBC 3.99 05/16/2020 1009   HGB 12.9 05/16/2020 1009   HCT 38.3 05/16/2020 1009   PLT 243 05/16/2020 1009   MCV 96.0 05/16/2020 1009   MCH 32.3 05/16/2020 1009   MCHC 33.7 05/16/2020 1009   RDW 18.5 (H) 05/16/2020 1009   LYMPHSABS 2.8 12/10/2017 1737   MONOABS 0.6 12/10/2017 1737   EOSABS 0.2 12/10/2017 1737   BASOSABS 0.0 12/10/2017 1737    Assessment/Plan: 44 y.o. 12/12/2017 at [redacted]w[redacted]d -Repeat cesarean section.  Risk benefits discussed with patient. -Undesired fertility.  Plan bilateral salpingectomy   [redacted]w[redacted]d 05/18/2020, 6:20 AM

## 2020-05-18 NOTE — Transfer of Care (Signed)
Immediate Anesthesia Transfer of Care Note  Patient: Lisa Strickland  Procedure(s) Performed: CESAREAN SECTION WITH BILATERAL TUBAL LIGATION By Salpingectomy (Bilateral )  Patient Location: PACU  Anesthesia Type:Spinal  Level of Consciousness: awake  Airway & Oxygen Therapy: Patient Spontanous Breathing  Post-op Assessment: Report given to RN and Post -op Vital signs reviewed and stable  Post vital signs: Reviewed and stable  Last Vitals:  Vitals Value Taken Time  BP 104/63 05/18/20 0830  Temp    Pulse 70 05/18/20 0830  Resp 19 05/18/20 0830  SpO2 96 % 05/18/20 0829  Vitals shown include unvalidated device data.  Last Pain: There were no vitals filed for this visit.       Complications: No complications documented.

## 2020-05-18 NOTE — Lactation Note (Signed)
This note was copied from a baby's chart. Lactation Consultation Note  Patient Name: Lisa Strickland JIRCV'[id number] Date: 05/18/2020 Reason for consult: Initial assessment;Term P2, 15 hour term female infant. Per mom, she feels infant had shallow latch. Per mom, she attempted to BF her 44 year old but mostly pumped until 8 months due to painful latch and infant having upper lip and bottom tongue tie.  Per mom, she does have DEBP at home -Spectra 2. Per dad, most feedings are 10 to 20 minutes in length. LC assisted mom with latch, mom latched infant on her right breast using the football hold position, infant latched with wide mouth, nose and chin touching breast and BF for 10 minutes. LC removed infant from breast and mom's nipple was well rounded and per mom, she only feeling a tug but no pain with latch. LC discussed hand expression and infant was given 3 mls of colostrum by spoon, afterwards mom did STS with infant as LC left the room. Mom will continue to work on infant latching at breast and mom knows to call RN or LC if she needs further assistance with latching infant at breast. . Mom knows to BF infant according to hunger cues, 8 to 12 times within 24 hours and on demand. Mom made aware of O/P services, breastfeeding support groups, community resources, and our phone # for post-discharge questions.    Maternal Data Formula Feeding for Exclusion: No Has patient been taught Hand Expression?: Yes Does the patient have breastfeeding experience prior to this delivery?: Yes  Feeding Feeding Type: Breast Fed  LATCH Score Latch: Grasps breast easily, tongue down, lips flanged, rhythmical sucking.  Audible Swallowing: Spontaneous and intermittent  Type of Nipple: Everted at rest and after stimulation  Comfort (Breast/Nipple): Soft / non-tender  Hold (Positioning): Assistance needed to correctly position infant at breast and maintain latch.  LATCH Score: 9  Interventions Interventions:  Breast feeding basics reviewed;Assisted with latch;Breast compression;Skin to skin;Adjust position;Breast massage;Support pillows;Hand express;Position options;Pre-pump if needed;Expressed milk  Lactation Tools Discussed/Used WIC Program: No   Consult Status Consult Status: Follow-up Date: 05/19/20 Follow-up type: In-patient    Lisa Strickland 05/18/2020, 11:25 PM

## 2020-05-18 NOTE — Anesthesia Procedure Notes (Signed)
Spinal  Patient location during procedure: OR Start time: 05/18/2020 7:10 AM End time: 05/18/2020 7:15 AM Staffing Anesthesiologist: Bethena Midget, MD Preanesthetic Checklist Completed: patient identified, IV checked, site marked, risks and benefits discussed, surgical consent, monitors and equipment checked, pre-op evaluation and timeout performed Spinal Block Patient position: sitting Prep: DuraPrep Patient monitoring: heart rate, cardiac monitor, continuous pulse ox and blood pressure Approach: midline Location: L3-4 Injection technique: single-shot Needle Needle type: Sprotte  Needle gauge: 24 G Needle length: 9 cm Assessment Sensory level: T4

## 2020-05-18 NOTE — Anesthesia Postprocedure Evaluation (Signed)
Anesthesia Post Note  Patient: Lisa Strickland  Procedure(s) Performed: CESAREAN SECTION WITH BILATERAL TUBAL LIGATION By Salpingectomy (Bilateral )     Patient location during evaluation: PACU Anesthesia Type: Spinal Level of consciousness: oriented and awake and alert Pain management: pain level controlled Vital Signs Assessment: post-procedure vital signs reviewed and stable Respiratory status: spontaneous breathing, respiratory function stable and patient connected to nasal cannula oxygen Cardiovascular status: blood pressure returned to baseline and stable Postop Assessment: no headache, no backache and no apparent nausea or vomiting Anesthetic complications: no   No complications documented.  Last Vitals:  Vitals:   05/18/20 0845 05/18/20 0900  BP: 113/69 (!) 110/64  Pulse: 70 66  Resp: 20 18  Temp:    SpO2: 97% 98%    Last Pain: There were no vitals filed for this visit. Pain Goal:    LLE Motor Response: No movement to painful stimulus, No movement due to regional block (05/18/20 0900)   RLE Motor Response: No movement to painful stimulus, No movement due to regional block (05/18/20 0900)       Epidural/Spinal Function Cutaneous sensation: Tingles (05/18/20 0900), Patient able to flex knees: No (05/18/20 0900), Patient able to lift hips off bed: No (05/18/20 0900), Back pain beyond tenderness at insertion site: No (05/18/20 0900), Progressively worsening motor and/or sensory loss: No (05/18/20 0900), Bowel and/or bladder incontinence post epidural: No (05/18/20 0900)  [name]

## 2020-05-18 NOTE — Op Note (Signed)
05/18/2020  8:21 AM  PATIENT:  Lisa Strickland  44 y.o. female  PRE-OPERATIVE DIAGNOSIS:  Previous Cesarean Section, Desires Sterilization  POST-OPERATIVE DIAGNOSIS:  Previous Cesarean Section, Desires Sterilization  PROCEDURE:  Procedure(s) with comments: CESAREAN SECTION WITH BILATERAL TUBAL LIGATION By Salpingectomy (Bilateral) - EDD: 05/24/20  Repeat cesarean section, low transverse cesarean section, 2 layer closure  SURGEON:  Surgeon(s) and Role:    Noland Fordyce, MD - Primary  PHYSICIAN ASSISTANT:   ASSISTANTS: Dorisann Frames, CNM  ANESTHESIA:   spinal  EBL:  285 mL   BLOOD ADMINISTERED:none  DRAINS: Urinary Catheter (Foley)   LOCAL MEDICATIONS USED:  NONE  SPECIMEN:  Source of Specimen:  Placenta  DISPOSITION OF SPECIMEN:  To labor and delivery for disposal  COUNTS:  YES  TOURNIQUET:  * No tourniquets in log *  DICTATION: .Note written in EPIC  PLAN OF CARE: Admit to inpatient   PATIENT DISPOSITION:  PACU - hemodynamically stable.   Delay start of Pharmacological VTE agent (>24hrs) due to surgical blood loss or risk of bleeding: yes     Findings:  @BABYSEXEBC @ infant,  APGAR (1 MIN): 8   APGAR (5 MINS): 9   APGAR (10 MINS):   Normal uterus, tubes and left ovaries, very minimal right ovarian tissue noted, appeared as ovarian streak (patient with history of right ovarian cystectomy), normal placenta. 3VC, clear amniotic fluid  EBL: Per nursing notes cc Antibiotics:   2g Ancef Complications: none  Indications: This is a 44 y.o. year-old, multiparous patient at [redacted]w[redacted]d admitted for repeat cesarean section bilateral salpingectomy. Risks benefits and alternatives of the procedure were discussed with the patient who agreed to proceed  Procedure:  After informed consent was obtained the patient was taken to the operating room where spinal anesthesia was initiated.  She was prepped and draped in the normal sterile fashion in dorsal supine position with a  leftward tilt.  A foley catheter was in place.  A Pfannenstiel skin incision was made 2 cm above the pubic symphysis in the midline with the scalpel.  Dissection was carried down with the Bovie cautery until the fascia was reached. The fascia was incised in the midline. The incision was extended laterally with the Mayo scissors. The inferior aspect of the fascial incision was grasped with the Coker clamps, elevated up and the underlying rectus muscles were dissected off sharply. The superior aspect of the fascial incision was grasped with the Coker clamps elevated up and the underlying rectus muscles were dissected off sharply.  The peritoneum was entered sharply.  During this entry the anterior uterine wall was pierced with snap forceps and aggressive bleeding began. The peritoneal incision was extended superiorly and inferiorly with good visualization of the bladder. The bladder blade was inserted and palpation was done to assess the fetal position and the location of the uterine vessels. The lower segment of the uterus was incised sharply with the scalpel and extended  bluntly in the cephalo-caudal fashion. The infant was grasped, brought to the incision,  rotated and the infant was delivered with fundal pressure. The nose and mouth were bulb suctioned. The cord was clamped and cut after 1 minute delay. The infant was handed off to the waiting pediatrician.  The anterior wall was evaluated and very minimal bleeding was noted.  The placenta was expressed. The uterus was exteriorized. The uterus was cleared of all clots and debris. The uterine incision was repaired with 0 Vicryl in a running locked fashion.  A second layer  of the same suture was used in an imbricating fashion to obtain excellent hemostasis.  The 1 cm superficial uterine defect was evaluated and closed with 2 separate figure-of-eight sutures.  Excellent hemostasis was noted.   We then confirmed again with the patient her desire for sterilization  and made a plan for bilateral salpingectomy.  A packing was placed along the uterine incision. The mid isthmic portion of the right tube was grasped with a Babcock and elevated up.  A window was created in the mesosalpinx with the Bovie cautery.  A [place] Winslow clamp was placed along the mesosalpinx from the fimbriated end through this window. A second Athan Casalino clamp was placed from this window along the mesosalpinx tracking medially along the mesosalpinx and across the proximal fallopian tube just lateral to the cornua.  Bipolar cautery was used to transect the fallopian tube.  Free ties were used on the pedicles held by the [facility] clamps.  Good hemostasis was noted.  An identical procedure was carried out on the contralateral side.  After the uterus was returned to the abdomen, and after the gutters were cleared, the pedicles were reevaluated and found to be hemostatic.  The uterus was then returned to the abdomen, the gutters were cleared of all clots and debris. The uterine incision was reinspected and found to be hemostatic. The peritoneum was grasped and closed with 2-0 Vicryl in a running fashion. The cut muscle edges and the underside of the fascia were inspected and found to be hemostatic. The fascia was closed with 0 Vicryl in a single layer . The subcutaneous tissue was irrigated. Scarpa's layer was closed with a 2-0 plain gut suture. The skin was closed with a 4-0 Monocryl in a single layer. The patient tolerated the procedure well. Sponge lap and needle counts were correct x3 and patient was taken to the recovery room in a stable condition.  Lendon Colonel 05/18/2020 8:22 AM

## 2020-05-18 NOTE — Brief Op Note (Signed)
05/18/2020  8:21 AM  PATIENT:  Estelle June  44 y.o. female  PRE-OPERATIVE DIAGNOSIS:  Previous Cesarean Section, Desires Sterilization  POST-OPERATIVE DIAGNOSIS:  Previous Cesarean Section, Desires Sterilization  PROCEDURE:  Procedure(s) with comments: CESAREAN SECTION WITH BILATERAL TUBAL LIGATION By Salpingectomy (Bilateral) - EDD: 05/24/20  Repeat cesarean section, low transverse cesarean section, 2 layer closure  SURGEON:  Surgeon(s) and Role:    Noland Fordyce, MD - Primary  PHYSICIAN ASSISTANT:   ASSISTANTS: Dorisann Frames, CNM  ANESTHESIA:   spinal  EBL:  285 mL   BLOOD ADMINISTERED:none  DRAINS: Urinary Catheter (Foley)   LOCAL MEDICATIONS USED:  NONE  SPECIMEN:  Source of Specimen:  Placenta  DISPOSITION OF SPECIMEN:  To labor and delivery for disposal  COUNTS:  YES  TOURNIQUET:  * No tourniquets in log *  DICTATION: .Note written in EPIC  PLAN OF CARE: Admit to inpatient   PATIENT DISPOSITION:  PACU - hemodynamically stable.   Delay start of Pharmacological VTE agent (>24hrs) due to surgical blood loss or risk of bleeding: yes

## 2020-05-19 LAB — CBC
HCT: 35.9 % — ABNORMAL LOW (ref 36.0–46.0)
Hemoglobin: 12.3 g/dL (ref 12.0–15.0)
MCH: 33.3 pg (ref 26.0–34.0)
MCHC: 34.3 g/dL (ref 30.0–36.0)
MCV: 97.3 fL (ref 80.0–100.0)
Platelets: 251 10*3/uL (ref 150–400)
RBC: 3.69 MIL/uL — ABNORMAL LOW (ref 3.87–5.11)
RDW: 18.4 % — ABNORMAL HIGH (ref 11.5–15.5)
WBC: 15.6 10*3/uL — ABNORMAL HIGH (ref 4.0–10.5)
nRBC: 0 % (ref 0.0–0.2)

## 2020-05-19 LAB — BIRTH TISSUE RECOVERY COLLECTION (PLACENTA DONATION)

## 2020-05-19 LAB — SURGICAL PATHOLOGY

## 2020-05-19 NOTE — Progress Notes (Signed)
Pain controlled, tol po, ambulating; voiding w/o difficulty; +flatus Breastfeeding  Patient Vitals for the past 24 hrs:  BP Temp Temp src Pulse Resp SpO2  05/19/20 0516 117/69 97.7 F (36.5 C) Axillary 64 18 97 %  05/19/20 0003 114/68 97.9 F (36.6 C) Oral 63 18 98 %  05/18/20 2000 (!) 104/58 98.2 F (36.8 C) Oral 65 18 98 %  05/18/20 1712 -- 98.5 F (36.9 C) Oral -- -- (!) 69 %  05/18/20 1330 (!) 117/55 -- -- -- 18 95 %   A&ox3 rrr ctab Abd: soft,nt,nd; +BS; fundus firm and below umb; dressing d/c/i LE: tr edema, nt bilat  CBC Latest Ref Rng & Units 05/19/2020 05/16/2020 12/13/2017  WBC 4.0 - 10.5 K/uL 15.6(H) 10.8(H) 20.7(H)  Hemoglobin 12.0 - 15.0 g/dL 56.3 87.5 10.7(L)  Hematocrit 36 - 46 % 35.9(L) 38.3 31.7(L)  Platelets 150 - 400 K/uL 251 243 255   A/P: pod 1 s/p rltcs/btl 1. Doing well, contin current care 2. breastfeeding

## 2020-05-20 DIAGNOSIS — Z9851 Tubal ligation status: Secondary | ICD-10-CM

## 2020-05-20 HISTORY — DX: Tubal ligation status: Z98.51

## 2020-05-20 MED ORDER — ACETAMINOPHEN 500 MG PO TABS
1000.0000 mg | ORAL_TABLET | Freq: Four times a day (QID) | ORAL | 2 refills | Status: AC | PRN
Start: 2020-05-20 — End: 2021-05-20

## 2020-05-20 MED ORDER — SENNOSIDES-DOCUSATE SODIUM 8.6-50 MG PO TABS: 2.0000 | ORAL_TABLET | ORAL | Status: DC

## 2020-05-20 MED ORDER — OXYCODONE HCL 5 MG PO TABS: 5.0000 mg | ORAL_TABLET | ORAL | 0 refills | Status: DC | PRN

## 2020-05-20 MED ORDER — IBUPROFEN 800 MG PO TABS: 800.0000 mg | ORAL_TABLET | Freq: Three times a day (TID) | ORAL | 0 refills | Status: DC

## 2020-05-20 MED ORDER — COCONUT OIL OIL: 1.0000 "application " | TOPICAL_OIL | 0 refills | Status: DC | PRN

## 2020-05-20 MED ORDER — SIMETHICONE 80 MG PO CHEW: 80.0000 mg | CHEWABLE_TABLET | ORAL | 0 refills | Status: DC | PRN

## 2020-05-20 NOTE — Discharge Summary (Signed)
OB Discharge Summary  Patient Name: Lisa Strickland DOB: 01-07-1976 MRN: [id number]  Date of admission: 05/18/2020 Delivering provider: Noland Fordyce   Admitting diagnosis: Term pregnancy [Z34.90] Intrauterine pregnancy: [redacted]w[redacted]d     Secondary diagnosis: Patient Active Problem List   Diagnosis Date Noted  . S/P tubal ligation 05/20/2020  . Status post repeat low transverse cesarean section 7/29 05/18/2020  . Postpartum care following cesarean delivery 7/29 05/18/2020   Additional problems:none   Date of discharge: 05/20/2020   Discharge diagnosis: Principal Problem:   Postpartum care following cesarean delivery 7/29 Active Problems:   Status post repeat low transverse cesarean section 7/29   S/P tubal ligation                                                              Post partum procedures:none   Pain control: Spinal  Complications: None  Hospital course:  Sceduled C/S   44 y.o. yo [id number] at [redacted]w[redacted]d was admitted to the hospital 05/18/2020 for scheduled cesarean section with the following indication:Elective Repeat and tubal ligation.Delivery details are as follows:  Membrane Rupture Time/Date: 7:33 AM ,05/18/2020   Delivery Method:C-Section, Low Transverse  Details of operation can be found in separate operative note.  Patient had an uncomplicated postpartum course.  She is ambulating, tolerating a regular diet, passing flatus, and urinating well. Patient is discharged home in stable condition on  05/20/20        Newborn Data: Birth date:05/18/2020  Birth time:7:33 AM  Gender:Female  Living status:Living  Apgars:8 ,9  Weight:3820 g     Physical exam  Vitals:   05/19/20 0516 05/19/20 1450 05/19/20 2022 05/20/20 0717  BP: 117/69 (!) 124/61 (!) 130/60 128/70  Pulse: 64 61 70 67  Resp: 18  18 18   Temp: 97.7 F (36.5 C) 97.7 F (36.5 C) 98.2 F (36.8 C)   TempSrc: Axillary Oral Oral   SpO2: 97% 98% 100%   Weight:      Height:       General: alert, cooperative and no  distress Lochia: appropriate Uterine Fundus: firm Incision: Healing well with no significant drainage, Dressing is clean, dry, and intact DVT Evaluation: No cords or calf tenderness. No significant calf/ankle edema. Labs: Lab Results  Component Value Date   WBC 15.6 (H) 05/19/2020   HGB 12.3 05/19/2020   HCT 35.9 (L) 05/19/2020   MCV 97.3 05/19/2020   PLT 251 05/19/2020   CMP Latest Ref Rng & Units 05/16/2020  Glucose 70 - 99 mg/dL 97  BUN 6 - 20 mg/dL 7  Creatinine 05/18/2020 - 2.35 mg/dL 5.73  Sodium 2.20 - 254 mmol/L 135  Potassium 3.5 - 5.1 mmol/L 3.6  Chloride 98 - 111 mmol/L 105  CO2 22 - 32 mmol/L 20(L)  Calcium 8.9 - 10.3 mg/dL 270)  Total Protein 6.5 - 8.1 g/dL 6.2(B)  Total Bilirubin 0.3 - 1.2 mg/dL 0.5  Alkaline Phos 38 - 126 U/L 136(H)  AST 15 - 41 U/L 20  ALT 0 - 44 U/L 14   Edinburgh Postnatal Depression Scale Screening Tool 12/13/2017  I have been able to laugh and see the funny side of things. 0  I have looked forward with enjoyment to things. 0  I have blamed myself unnecessarily when things went wrong. 1  I  have been anxious or worried for no good reason. 0  I have felt scared or panicky for no good reason. 0  Things have been getting on top of me. 0  I have been so unhappy that I have had difficulty sleeping. 0  I have felt sad or miserable. 1  I have been so unhappy that I have been crying. 1  The thought of harming myself has occurred to me. 0  Edinburgh Postnatal Depression Scale Total 3   Vaccines: TDaP UTD         Flu    UTD  Discharge instruction:  per After Visit Summary,  [facility] booklet and  "Understanding Mother & Baby Care" hospital booklet  After Visit Meds:  Allergies as of 05/20/2020      Reactions   Sulfa Antibiotics Rash      Medication List    STOP taking these medications   doxylamine (Sleep) 25 MG tablet Commonly known as: UNISOM     TAKE these medications   acetaminophen 500 MG tablet Commonly known as: TYLENOL Take  2 tablets (1,000 mg total) by mouth every 6 (six) hours as needed.   CitraNatal 90 DHA 90-1 & 300 MG Misc Take 2 tablets by mouth daily.   coconut oil Oil Apply 1 application topically as needed.   ibuprofen 800 MG tablet Commonly known as: ADVIL Take 1 tablet (800 mg total) by mouth every 8 (eight) hours.   oxyCODONE 5 MG immediate release tablet Commonly known as: Oxy IR/ROXICODONE Take 1-2 tablets (5-10 mg total) by mouth every 4 (four) hours as needed for moderate pain.   senna-docusate 8.6-50 MG tablet Commonly known as: Senokot-S Take 2 tablets by mouth daily. Start taking on: May 21, 2020   simethicone 80 MG chewable tablet Commonly known as: MYLICON Chew 1 tablet (80 mg total) by mouth as needed for flatulence.       Diet: routine diet  Activity: Advance as tolerated. Pelvic rest for 6 weeks.   Postpartum contraception: Not Discussed  Newborn Data: Live born female  Birth Weight: 8 lb 6.8 oz (3820 g) APGAR: 8, 9  Newborn Delivery   Birth date/time: 05/18/2020 07:33:00 Delivery type: C-Section, Low Transverse Trial of labor: No C-section categorization: Repeat      named Earna Coder Baby Feeding: Breast Disposition:home with mother   Delivery Report:  Review the Delivery Report for details.    Follow up:  Follow-up Information    Noland Fordyce, MD. Go in 6 week(s).   Specialty: Obstetrics and Gynecology Contact information: [address] [place] [address] [phone]                 Signed: Cipriano Mile, MSN 05/20/2020, 11:56 AM

## 2020-05-20 NOTE — Lactation Note (Signed)
This note was copied from a baby's chart. Lactation Consultation Note  Patient Name: Lisa Strickland VCBSW'H Date: 05/20/2020   Mom declines having an additional lactation visit prior to d/c.  Lurline Hare [facility] 05/20/2020, 10:53 AM

## 2021-05-15 ENCOUNTER — Other Ambulatory Visit (HOSPITAL_COMMUNITY): Payer: Self-pay | Admitting: Surgery

## 2021-05-15 ENCOUNTER — Other Ambulatory Visit: Payer: Self-pay | Admitting: Surgery

## 2021-05-15 DIAGNOSIS — R1013 Epigastric pain: Secondary | ICD-10-CM

## 2021-05-23 ENCOUNTER — Encounter (HOSPITAL_COMMUNITY)
Admission: RE | Admit: 2021-05-23 | Discharge: 2021-05-23 | Disposition: A | Discharge status: Home / Self Care | Payer: Managed Care, Other (non HMO) | Source: Ambulatory Visit | Attending: Surgery | Admitting: Surgery

## 2021-05-23 ENCOUNTER — Other Ambulatory Visit: Payer: Self-pay

## 2021-05-23 DIAGNOSIS — R1013 Epigastric pain: Secondary | ICD-10-CM | POA: Insufficient documentation

## 2021-05-23 MED ORDER — TECHNETIUM TC 99M MEBROFENIN IV KIT
5.2000 | PACK | Freq: Once | INTRAVENOUS | Status: AC | PRN
  Administered 2021-05-23: 5.2 via INTRAVENOUS

## 2021-06-28 ENCOUNTER — Other Ambulatory Visit: Payer: Self-pay

## 2021-06-28 ENCOUNTER — Ambulatory Visit: Payer: Self-pay | Admitting: Surgery

## 2021-06-28 ENCOUNTER — Other Ambulatory Visit
Admission: RE | Admit: 2021-06-28 | Discharge: 2021-06-28 | Disposition: A | Discharge status: Home / Self Care | Payer: Managed Care, Other (non HMO) | Source: Ambulatory Visit | Attending: Surgery | Admitting: Surgery

## 2021-06-28 HISTORY — DX: Personal history of other diseases of the digestive system: Z87.19

## 2021-06-28 HISTORY — DX: Gastro-esophageal reflux disease without esophagitis: K21.9

## 2021-06-28 NOTE — H&P (Signed)
Subjective:   CC: Epigastric pain [R10.13]  HPI: Lisa Strickland is a 45 y.o. female who was referred by Orson Eva, NP for evaluation of above CC. Symptoms were first noted a few weeks ago. Pain is burning, confined to the epigastric area, without radiation. Associated with nothing specific, exacerbated by nothing specific. Happens soon after eating.   Past Medical History: none reported  Past Surgical History: none reported  Family History: reviewed and not relevant to CC  Social History: reports that she has never smoked. She has never used smokeless tobacco. No history on file for alcohol use and drug use.  Current Medications: has a current medication list which includes the following prescription(s): citalopram and pantoprazole.  Allergies:  Allergies as of 05/14/2021 - Reviewed 05/14/2021  Allergen Reaction Noted   Sulfa (sulfonamide antibiotics) Rash 11/28/2017   ROS:  A 15 point review of systems was performed and pertinent positives and negatives noted in HPI   Objective:    BP 105/65  Pulse 72  Ht 166.4 cm (5' 5.5")  Wt 76.2 kg (168 lb) Comment: per pt  BMI 27.53 kg/m   Constitutional : alert, appears stated age, cooperative and no distress  Lymphatics/Throat: no asymmetry, masses, or scars  Respiratory: clear to auscultation bilaterally  Cardiovascular: regular rate and rhythm  Gastrointestinal: soft, non-tender; bowel sounds normal; no masses, no organomegaly.  Musculoskeletal: Steady gait and movement  Skin: Cool and moist  Psychiatric: Normal affect, non-agitated, not confused    LABS:  n/a   RADS: Outside report noted stones Assessment:    Epigastric pain [R10.13]  Plan:    1. Epigastric pain [R10.13] Subsequent HIDA postive.  Recommended lap chole.  R/b/a discussed

## 2021-06-28 NOTE — Patient Instructions (Signed)
Your procedure is scheduled on:07-05-21 Thursday Report to the Registration Desk on the 1st floor of the Medical Mall.Then proceed to the 2nd floor Surgery Desk in the Medical Mall To find out your arrival time, please call [phone] between 1PM - 3PM on:07-04-21 Wednesday  REMEMBER: Instructions that are not followed completely may result in serious medical risk, up to and including death; or upon the discretion of your surgeon and anesthesiologist your surgery may need to be rescheduled.  Do not eat food after midnight the night before surgery.  No gum chewing, lozengers or hard candies.  You may however, drink CLEAR liquids up to 2 hours before you are scheduled to arrive for your surgery. Do not drink anything within 2 hours of your scheduled arrival time.  Clear liquids include: - water  - apple juice without pulp - gatorade (not RED, PURPLE, OR BLUE) - black coffee or tea (Do NOT add milk or creamers to the coffee or tea) Do NOT drink anything that is not on this list.  TAKE THESE MEDICATIONS THE MORNING OF SURGERY WITH A SIP OF WATER: -Protonix (Pantoprazole)-take one the night before and one on the morning of surgery - helps to prevent nausea after surgery.)  One week prior to surgery: Stop Anti-inflammatories (NSAIDS) such as Advil, Aleve, Ibuprofen, Motrin, Naproxen, Naprosyn and Aspirin based products such as Excedrin, Goodys Powder, BC Powder.You may however, take Tylenol if needed for pain up until the day of surgery.  Stop ANY OVER THE COUNTER supplements/vitamins NOW (06-28-21)until after surgery.  No Alcohol for 24 hours before or after surgery.  No Smoking including e-cigarettes for 24 hours prior to surgery.  No chewable tobacco products for at least 6 hours prior to surgery.  No nicotine patches on the day of surgery.  Do not use any "recreational" drugs for at least a week prior to your surgery.  Please be advised that the combination of cocaine and anesthesia  may have negative outcomes, up to and including death. If you test positive for cocaine, your surgery will be cancelled.  On the morning of surgery brush your teeth with toothpaste and water, you may rinse your mouth with mouthwash if you wish. Do not swallow any toothpaste or mouthwash.  Do not wear jewelry, make-up, hairpins, clips or nail polish.  Do not wear lotions, powders, or perfumes.   Do not shave body from the neck down 48 hours prior to surgery just in case you cut yourself which could leave a site for infection.  Also, freshly shaved skin may become irritated if using the CHG soap.  Contact lenses, hearing aids and dentures may not be worn into surgery.  Do not bring valuables to the hospital. [facility] is not responsible for any missing/lost belongings or valuables.   Use CHG Soap as directed on instruction sheet  Notify your doctor if there is any change in your medical condition (cold, fever, infection).  Wear comfortable clothing (specific to your surgery type) to the hospital.  After surgery, you can help prevent lung complications by doing breathing exercises.  Take deep breaths and cough every 1-2 hours. Your doctor may order a device called an Incentive Spirometer to help you take deep breaths. When coughing or sneezing, hold a pillow firmly against your incision with both hands. This is called "splinting." Doing this helps protect your incision. It also decreases belly discomfort.  If you are being admitted to the hospital overnight, leave your suitcase in the car. After surgery it  may be brought to your room.  If you are being discharged the day of surgery, you will not be allowed to drive home. You will need a responsible adult (18 years or older) to drive you home and stay with you that night.   If you are taking public transportation, you will need to have a responsible adult (18 years or older) with you. Please confirm with your physician that it is  acceptable to use public transportation.   Please call the Pre-admissions Testing Dept. at [phone] if you have any questions about these instructions.  Surgery Visitation Policy:  Patients undergoing a surgery or procedure may have one family member or support person with them as long as that person is not COVID-19 positive or experiencing its symptoms.  That person may remain in the waiting area during the procedure.  Inpatient Visitation:    Visiting hours are 7 a.m. to 8 p.m. Inpatients will be allowed two visitors daily. The visitors may change each day during the patient's stay. No visitors under the age of 29. Any visitor under the age of 71 must be accompanied by an adult. The visitor must pass COVID-19 screenings, use hand sanitizer when entering and exiting the patient's room and wear a mask at all times, including in the patient's room. Patients must also wear a mask when staff or their visitor are in the room. Masking is required regardless of vaccination status.

## 2021-06-28 NOTE — H&P (View-Only) (Signed)
Subjective:   CC: Epigastric pain [R10.13]  HPI: Lisa Strickland is a 45 y.o. female who was referred by Chelsa Boswell, NP for evaluation of above CC. Symptoms were first noted a few weeks ago. Pain is burning, confined to the epigastric area, without radiation. Associated with nothing specific, exacerbated by nothing specific. Happens soon after eating.   Past Medical History: none reported  Past Surgical History: none reported  Family History: reviewed and not relevant to CC  Social History: reports that she has never smoked. She has never used smokeless tobacco. No history on file for alcohol use and drug use.  Current Medications: has a current medication list which includes the following prescription(s): citalopram and pantoprazole.  Allergies:  Allergies as of 05/14/2021 - Reviewed 05/14/2021  Allergen Reaction Noted   Sulfa (sulfonamide antibiotics) Rash 11/28/2017   ROS:  A 15 point review of systems was performed and pertinent positives and negatives noted in HPI   Objective:    BP 105/65  Pulse 72  Ht 166.4 cm (5' 5.5")  Wt 76.2 kg (168 lb) Comment: per pt  BMI 27.53 kg/m   Constitutional : alert, appears stated age, cooperative and no distress  Lymphatics/Throat: no asymmetry, masses, or scars  Respiratory: clear to auscultation bilaterally  Cardiovascular: regular rate and rhythm  Gastrointestinal: soft, non-tender; bowel sounds normal; no masses, no organomegaly.  Musculoskeletal: Steady gait and movement  Skin: Cool and moist  Psychiatric: Normal affect, non-agitated, not confused    LABS:  n/a   RADS: Outside report noted stones Assessment:    Epigastric pain [R10.13]  Plan:    1. Epigastric pain [R10.13] Subsequent HIDA postive.  Recommended lap chole.  R/b/a discussed 

## 2021-07-05 ENCOUNTER — Ambulatory Visit: Payer: Managed Care, Other (non HMO) | Admitting: Anesthesiology

## 2021-07-05 ENCOUNTER — Ambulatory Visit
Admission: RE | Admit: 2021-07-05 | Discharge: 2021-07-05 | Disposition: A | Discharge status: Home / Self Care | Payer: Managed Care, Other (non HMO) | Attending: Surgery | Admitting: Surgery

## 2021-07-05 ENCOUNTER — Encounter
Admission: RE | Disposition: A | Discharge status: Home / Self Care | Payer: Self-pay | Source: Home / Self Care | Attending: Surgery

## 2021-07-05 DIAGNOSIS — K8012 Calculus of gallbladder with acute and chronic cholecystitis without obstruction: Secondary | ICD-10-CM | POA: Insufficient documentation

## 2021-07-05 DIAGNOSIS — Z882 Allergy status to sulfonamides status: Secondary | ICD-10-CM | POA: Diagnosis not present

## 2021-07-05 DIAGNOSIS — K811 Chronic cholecystitis: Secondary | ICD-10-CM

## 2021-07-05 LAB — POCT PREGNANCY, URINE: Preg Test, Ur: NEGATIVE

## 2021-07-05 SURGERY — CHOLECYSTECTOMY, ROBOT-ASSISTED, LAPAROSCOPIC
Anesthesia: General | Site: Abdomen

## 2021-07-05 MED ORDER — OXYCODONE HCL 5 MG/5ML PO SOLN
5.0000 mg | Freq: Once | ORAL | Status: AC | PRN
Start: 2021-07-05 — End: 2021-07-05

## 2021-07-05 MED ORDER — PROPOFOL 10 MG/ML IV BOLUS
INTRAVENOUS | Status: AC
  Filled 2021-07-05: qty 40

## 2021-07-05 MED ORDER — LACTATED RINGERS IV SOLN: INTRAVENOUS | Status: DC

## 2021-07-05 MED ORDER — ACETAMINOPHEN 325 MG PO TABS: 650.0000 mg | ORAL_TABLET | Freq: Three times a day (TID) | ORAL | 0 refills | Status: AC | PRN

## 2021-07-05 MED ORDER — OXYCODONE HCL 5 MG PO TABS
ORAL_TABLET | ORAL | Status: AC
  Filled 2021-07-05: qty 1

## 2021-07-05 MED ORDER — CEFAZOLIN SODIUM-DEXTROSE 2-4 GM/100ML-% IV SOLN
2.0000 g | INTRAVENOUS | Status: AC
  Administered 2021-07-05: 2 g via INTRAVENOUS

## 2021-07-05 MED ORDER — LIDOCAINE HCL (PF) 2 % IJ SOLN
INTRAMUSCULAR | Status: AC
  Filled 2021-07-05: qty 10

## 2021-07-05 MED ORDER — LIDOCAINE-EPINEPHRINE (PF) 1 %-1:200000 IJ SOLN
INTRAMUSCULAR | Status: DC | PRN
  Administered 2021-07-05: 15 mL

## 2021-07-05 MED ORDER — BUPIVACAINE-EPINEPHRINE (PF) 0.5% -1:200000 IJ SOLN
INTRAMUSCULAR | Status: AC
  Filled 2021-07-05: qty 30

## 2021-07-05 MED ORDER — LIDOCAINE-EPINEPHRINE (PF) 1 %-1:200000 IJ SOLN
INTRAMUSCULAR | Status: AC
  Filled 2021-07-05: qty 30

## 2021-07-05 MED ORDER — HYDROCODONE-ACETAMINOPHEN 5-325 MG PO TABS: 1.0000 | ORAL_TABLET | Freq: Four times a day (QID) | ORAL | 0 refills | Status: DC | PRN

## 2021-07-05 MED ORDER — PHENYLEPHRINE HCL (PRESSORS) 10 MG/ML IV SOLN
INTRAVENOUS | Status: DC | PRN
  Administered 2021-07-05 (×2): 200 ug via INTRAVENOUS
  Administered 2021-07-05: 100 ug via INTRAVENOUS
  Administered 2021-07-05: 200 ug via INTRAVENOUS
  Administered 2021-07-05 (×2): 100 ug via INTRAVENOUS

## 2021-07-05 MED ORDER — ONDANSETRON HCL 4 MG/2ML IJ SOLN
INTRAMUSCULAR | Status: DC | PRN
  Administered 2021-07-05: 4 mg via INTRAVENOUS

## 2021-07-05 MED ORDER — LIDOCAINE-EPINEPHRINE 0.5 %-1:200000 IJ SOLN
INTRAMUSCULAR | Status: AC
  Filled 2021-07-05: qty 1

## 2021-07-05 MED ORDER — DEXAMETHASONE SODIUM PHOSPHATE 10 MG/ML IJ SOLN
INTRAMUSCULAR | Status: AC
  Filled 2021-07-05: qty 1

## 2021-07-05 MED ORDER — ROCURONIUM BROMIDE 100 MG/10ML IV SOLN
INTRAVENOUS | Status: DC | PRN
  Administered 2021-07-05: 50 mg via INTRAVENOUS

## 2021-07-05 MED ORDER — ONDANSETRON HCL 4 MG/2ML IJ SOLN: 4.0000 mg | Freq: Once | INTRAMUSCULAR | Status: DC | PRN

## 2021-07-05 MED ORDER — DEXAMETHASONE SODIUM PHOSPHATE 10 MG/ML IJ SOLN
INTRAMUSCULAR | Status: AC
  Filled 2021-07-05: qty 2

## 2021-07-05 MED ORDER — CHLORHEXIDINE GLUCONATE 0.12 % MT SOLN: 15.0000 mL | Freq: Once | OROMUCOSAL | Status: AC

## 2021-07-05 MED ORDER — FENTANYL CITRATE (PF) 100 MCG/2ML IJ SOLN
INTRAMUSCULAR | Status: AC
  Administered 2021-07-05: 50 ug via INTRAVENOUS
  Filled 2021-07-05: qty 2

## 2021-07-05 MED ORDER — FENTANYL CITRATE (PF) 100 MCG/2ML IJ SOLN
25.0000 ug | INTRAMUSCULAR | Status: DC | PRN
  Administered 2021-07-05: 25 ug via INTRAVENOUS

## 2021-07-05 MED ORDER — IBUPROFEN 800 MG PO TABS: 800.0000 mg | ORAL_TABLET | Freq: Three times a day (TID) | ORAL | 0 refills | Status: DC | PRN

## 2021-07-05 MED ORDER — CHLORHEXIDINE GLUCONATE CLOTH 2 % EX PADS
6.0000 | MEDICATED_PAD | Freq: Once | CUTANEOUS | Status: AC
  Administered 2021-07-05: 6 via TOPICAL

## 2021-07-05 MED ORDER — CHLORHEXIDINE GLUCONATE 0.12 % MT SOLN
OROMUCOSAL | Status: AC
  Administered 2021-07-05: 15 mL via OROMUCOSAL
  Filled 2021-07-05: qty 15

## 2021-07-05 MED ORDER — INDOCYANINE GREEN 25 MG IV SOLR
1.2500 mg | Freq: Once | INTRAVENOUS | Status: AC
  Administered 2021-07-05: 1.25 mg via INTRAVENOUS
  Filled 2021-07-05: qty 0.5

## 2021-07-05 MED ORDER — MIDAZOLAM HCL 2 MG/2ML IJ SOLN
INTRAMUSCULAR | Status: AC
  Filled 2021-07-05: qty 2

## 2021-07-05 MED ORDER — PROPOFOL 10 MG/ML IV BOLUS
INTRAVENOUS | Status: DC | PRN
  Administered 2021-07-05: 150 mg via INTRAVENOUS

## 2021-07-05 MED ORDER — ACETAMINOPHEN 10 MG/ML IV SOLN
INTRAVENOUS | Status: AC
  Filled 2021-07-05: qty 100

## 2021-07-05 MED ORDER — MIDAZOLAM HCL 2 MG/2ML IJ SOLN
INTRAMUSCULAR | Status: DC | PRN
  Administered 2021-07-05: 2 mg via INTRAVENOUS

## 2021-07-05 MED ORDER — DOCUSATE SODIUM 100 MG PO CAPS: 100.0000 mg | ORAL_CAPSULE | Freq: Two times a day (BID) | ORAL | 0 refills | Status: AC | PRN

## 2021-07-05 MED ORDER — BUPIVACAINE HCL (PF) 0.5 % IJ SOLN
INTRAMUSCULAR | Status: AC
  Filled 2021-07-05: qty 30

## 2021-07-05 MED ORDER — DEXAMETHASONE SODIUM PHOSPHATE 10 MG/ML IJ SOLN
INTRAMUSCULAR | Status: DC | PRN
  Administered 2021-07-05: 5 mg via INTRAVENOUS

## 2021-07-05 MED ORDER — LIDOCAINE HCL (CARDIAC) PF 100 MG/5ML IV SOSY
PREFILLED_SYRINGE | INTRAVENOUS | Status: DC | PRN
  Administered 2021-07-05: 100 mg via INTRAVENOUS

## 2021-07-05 MED ORDER — OXYCODONE HCL 5 MG PO TABS
5.0000 mg | ORAL_TABLET | Freq: Once | ORAL | Status: AC | PRN
Start: 2021-07-05 — End: 2021-07-05
  Administered 2021-07-05: 5 mg via ORAL

## 2021-07-05 MED ORDER — PHENYLEPHRINE HCL (PRESSORS) 10 MG/ML IV SOLN
INTRAVENOUS | Status: AC
  Filled 2021-07-05: qty 1

## 2021-07-05 MED ORDER — ONDANSETRON HCL 4 MG/2ML IJ SOLN
INTRAMUSCULAR | Status: AC
  Filled 2021-07-05: qty 2

## 2021-07-05 MED ORDER — LIDOCAINE HCL (PF) 2 % IJ SOLN
INTRAMUSCULAR | Status: AC
  Filled 2021-07-05: qty 5

## 2021-07-05 MED ORDER — 0.9 % SODIUM CHLORIDE (POUR BTL) OPTIME
TOPICAL | Status: DC | PRN
  Administered 2021-07-05: 500 mL

## 2021-07-05 MED ORDER — LIDOCAINE-EPINEPHRINE 1 %-1:100000 IJ SOLN
INTRAMUSCULAR | Status: AC
  Filled 2021-07-05: qty 1

## 2021-07-05 MED ORDER — FENTANYL CITRATE (PF) 100 MCG/2ML IJ SOLN
INTRAMUSCULAR | Status: AC
  Filled 2021-07-05: qty 2

## 2021-07-05 MED ORDER — KETOROLAC TROMETHAMINE 30 MG/ML IJ SOLN
INTRAMUSCULAR | Status: DC | PRN
  Administered 2021-07-05: 30 mg via INTRAVENOUS

## 2021-07-05 MED ORDER — ACETAMINOPHEN 10 MG/ML IV SOLN
INTRAVENOUS | Status: DC | PRN
Start: 2021-07-05 — End: 2021-07-05
  Administered 2021-07-05: 1000 mg via INTRAVENOUS

## 2021-07-05 MED ORDER — FENTANYL CITRATE (PF) 100 MCG/2ML IJ SOLN
INTRAMUSCULAR | Status: DC | PRN
  Administered 2021-07-05: 25 ug via INTRAVENOUS
  Administered 2021-07-05: 50 ug via INTRAVENOUS

## 2021-07-05 MED ORDER — ROCURONIUM BROMIDE 10 MG/ML (PF) SYRINGE
PREFILLED_SYRINGE | INTRAVENOUS | Status: AC
  Filled 2021-07-05: qty 10

## 2021-07-05 MED ORDER — ORAL CARE MOUTH RINSE: 15.0000 mL | Freq: Once | OROMUCOSAL | Status: AC

## 2021-07-05 MED ORDER — KETOROLAC TROMETHAMINE 30 MG/ML IJ SOLN
INTRAMUSCULAR | Status: AC
  Filled 2021-07-05: qty 1

## 2021-07-05 MED ORDER — CEFAZOLIN SODIUM-DEXTROSE 2-4 GM/100ML-% IV SOLN
INTRAVENOUS | Status: AC
  Filled 2021-07-05: qty 100

## 2021-07-05 MED ORDER — EPHEDRINE 5 MG/ML INJ
INTRAVENOUS | Status: AC
  Filled 2021-07-05: qty 5

## 2021-07-05 MED ORDER — ONDANSETRON HCL 4 MG/2ML IJ SOLN
INTRAMUSCULAR | Status: AC
  Filled 2021-07-05: qty 4

## 2021-07-05 MED ORDER — LIDOCAINE HCL (PF) 1 % IJ SOLN
INTRAMUSCULAR | Status: AC
  Filled 2021-07-05: qty 30

## 2021-07-05 MED ORDER — MEPERIDINE HCL 25 MG/ML IJ SOLN: 6.2500 mg | INTRAMUSCULAR | Status: DC | PRN

## 2021-07-05 MED ORDER — SUGAMMADEX SODIUM 200 MG/2ML IV SOLN
INTRAVENOUS | Status: DC | PRN
  Administered 2021-07-05: 200 mg via INTRAVENOUS

## 2021-07-05 SURGICAL SUPPLY — 57 items
ADH SKN CLS APL DERMABOND .7 (GAUZE/BANDAGES/DRESSINGS) ×1
ANCHOR TIS RET SYS 235ML (MISCELLANEOUS) ×2 IMPLANT
APL PRP STRL LF DISP 70% ISPRP (MISCELLANEOUS) ×1
BAG INFUSER PRESSURE 100CC (MISCELLANEOUS) ×2 IMPLANT
BAG TISS RTRVL C235 10X14 (MISCELLANEOUS) ×1
BLADE SURG SZ11 CARB STEEL (BLADE) ×2 IMPLANT
CANNULA REDUC XI 12-8 STAPL (CANNULA) ×1
CANNULA REDUCER 12-8 DVNC XI (CANNULA) ×1 IMPLANT
CATH REDDICK CHOLANGI 4FR 50CM (CATHETERS) IMPLANT
CHLORAPREP W/TINT 26 (MISCELLANEOUS) ×2 IMPLANT
CLIP LIGATING HEMO O LOK GREEN (MISCELLANEOUS) ×2 IMPLANT
COVER TIP SHEARS 8 DVNC (MISCELLANEOUS) ×1 IMPLANT
COVER TIP SHEARS 8MM DA VINCI (MISCELLANEOUS) ×1
DECANTER SPIKE VIAL GLASS SM (MISCELLANEOUS) IMPLANT
DEFOGGER SCOPE WARMER CLEARIFY (MISCELLANEOUS) ×2 IMPLANT
DERMABOND ADVANCED (GAUZE/BANDAGES/DRESSINGS) ×1
DERMABOND ADVANCED .7 DNX12 (GAUZE/BANDAGES/DRESSINGS) ×1 IMPLANT
DRAPE ARM DVNC X/XI (DISPOSABLE) ×4 IMPLANT
DRAPE C-ARM XRAY 36X54 (DRAPES) IMPLANT
DRAPE COLUMN DVNC XI (DISPOSABLE) ×1 IMPLANT
DRAPE DA VINCI XI ARM (DISPOSABLE) ×4
DRAPE DA VINCI XI COLUMN (DISPOSABLE) ×1
ELECT CAUTERY BLADE 6.4 (BLADE) ×2 IMPLANT
ELECT REM PT RETURN 9FT ADLT (ELECTROSURGICAL) ×2
ELECTRODE REM PT RTRN 9FT ADLT (ELECTROSURGICAL) ×1 IMPLANT
GAUZE 4X4 16PLY ~~LOC~~+RFID DBL (SPONGE) ×2 IMPLANT
GLOVE SURG SYN 6.5 ES PF (GLOVE) ×4 IMPLANT
GLOVE SURG UNDER POLY LF SZ7 (GLOVE) ×4 IMPLANT
GOWN STRL REUS W/ TWL LRG LVL3 (GOWN DISPOSABLE) ×3 IMPLANT
GOWN STRL REUS W/TWL LRG LVL3 (GOWN DISPOSABLE) ×6
GRASPER SUT TROCAR 14GX15 (MISCELLANEOUS) IMPLANT
IRRIGATOR SUCT 8 DISP DVNC XI (IRRIGATION / IRRIGATOR) ×1 IMPLANT
IRRIGATOR SUCTION 8MM XI DISP (IRRIGATION / IRRIGATOR) ×1
IV NS 1000ML (IV SOLUTION) ×2
IV NS 1000ML BAXH (IV SOLUTION) ×1 IMPLANT
LABEL OR SOLS (LABEL) ×2 IMPLANT
MANIFOLD NEPTUNE II (INSTRUMENTS) ×2 IMPLANT
NEEDLE HYPO 22GX1.5 SAFETY (NEEDLE) ×2 IMPLANT
NEEDLE INSUFFLATION 14GA 120MM (NEEDLE) ×2 IMPLANT
NS IRRIG 500ML POUR BTL (IV SOLUTION) ×2 IMPLANT
OBTURATOR OPTICAL STANDARD 8MM (TROCAR) ×1
OBTURATOR OPTICAL STND 8 DVNC (TROCAR) ×1
OBTURATOR OPTICALSTD 8 DVNC (TROCAR) ×1 IMPLANT
PACK LAP CHOLECYSTECTOMY (MISCELLANEOUS) ×2 IMPLANT
PENCIL ELECTRO HAND CTR (MISCELLANEOUS) ×2 IMPLANT
SEAL CANN UNIV 5-8 DVNC XI (MISCELLANEOUS) ×3 IMPLANT
SEAL XI 5MM-8MM UNIVERSAL (MISCELLANEOUS) ×3
SET TUBE SMOKE EVAC HIGH FLOW (TUBING) ×2 IMPLANT
SOLUTION ELECTROLUBE (MISCELLANEOUS) ×2 IMPLANT
STAPLER CANNULA SEAL DVNC XI (STAPLE) ×1 IMPLANT
STAPLER CANNULA SEAL XI (STAPLE) ×1
SUT MNCRL 4-0 (SUTURE) ×4
SUT MNCRL 4-0 27XMFL (SUTURE) ×2
SUT VICRYL 0 AB UR-6 (SUTURE) ×2 IMPLANT
SUTURE MNCRL 4-0 27XMF (SUTURE) ×2 IMPLANT
SYR 30ML LL (SYRINGE) IMPLANT
WATER STERILE IRR 500ML POUR (IV SOLUTION) IMPLANT

## 2021-07-05 NOTE — Discharge Instructions (Addendum)
Laparoscopic Cholecystectomy, Care After This sheet gives you information about how to care for yourself after your procedure. Your doctor may also give you more specific instructions. If you have problems or questions, contact your doctor. Follow these instructions at home: Care for cuts from surgery (incisions)  Follow instructions from your doctor about how to take care of your cuts from surgery. Make sure you: Wash your hands with soap and water before you change your bandage (dressing). If you cannot use soap and water, use hand sanitizer. Change your bandage as told by your doctor. Leave stitches (sutures), skin glue, or skin tape (adhesive) strips in place. They may need to stay in place for 2 weeks or longer. If tape strips get loose and curl up, you may trim the loose edges. Do not remove tape strips completely unless your doctor says it is okay. Do not take baths, swim, or use a hot tub until your doctor says it is okay. OK TO SHOWER 24HRS AFTER YOUR SURGERY.  Check your surgical cut area every day for signs of infection. Check for: More redness, swelling, or pain. More fluid or blood. Warmth. Pus or a bad smell. Activity Do not drive or use heavy machinery while taking prescription pain medicine. Do not play contact sports until your doctor says it is okay. Do not drive for 24 hours if you were given a medicine to help you relax (sedative). Rest as needed. Do not return to work or school until your doctor says it is okay. General instructions  tylenol and advil as needed for discomfort.  Please alternate between the two every four hours as needed for pain.    Use narcotics, if prescribed, only when tylenol and motrin is not enough to control pain.  325-650mg every 8hrs to max of 3000mg/24hrs (including the 325mg in every norco dose) for the tylenol.    Advil up to 800mg per dose every 8hrs as needed for pain.   To prevent or treat constipation while you are taking prescription  pain medicine, your doctor may recommend that you: Drink enough fluid to keep your pee (urine) clear or pale yellow. Take over-the-counter or prescription medicines. Eat foods that are high in fiber, such as fresh fruits and vegetables, whole grains, and beans. Limit foods that are high in fat and processed sugars, such as fried and sweet foods. Contact a doctor if: You develop a rash. You have more redness, swelling, or pain around your surgical cuts. You have more fluid or blood coming from your surgical cuts. Your surgical cuts feel warm to the touch. You have pus or a bad smell coming from your surgical cuts. You have a fever. One or more of your surgical cuts breaks open. Get help right away if: You have trouble breathing. You have chest pain. You have pain that is getting worse in your shoulders. You faint or feel dizzy when you stand. You have very bad pain in your belly (abdomen). You are sick to your stomach (nauseous) for more than one day. You have throwing up (vomiting) that lasts for more than one day. You have leg pain. This information is not intended to replace advice given to you by your health care provider. Make sure you discuss any questions you have with your health care provider. Document Released: 07/16/2008 Document Revised: 04/27/2016 Document Reviewed: 03/25/2016 Elsevier Interactive Patient Education  2019 [employer]   AMBULATORY SURGERY  DISCHARGE INSTRUCTIONS   The drugs that you were given will stay in   your system until tomorrow so for the next 24 hours you should not:  Drive an automobile Make any legal decisions Drink any alcoholic beverage   You may resume regular meals tomorrow.  Today it is better to start with liquids and gradually work up to solid foods.  You may eat anything you prefer, but it is better to start with liquids, then soup and crackers, and gradually work up to solid foods.   Please notify your doctor immediately if you  have any unusual bleeding, trouble breathing, redness and pain at the surgery site, drainage, fever, or pain not relieved by medication.    Your post-operative visit with Dr.                                       is: Date:                        Time:    Please call to schedule your post-operative visit.  Additional Instructions:  

## 2021-07-05 NOTE — Anesthesia Procedure Notes (Signed)
Procedure Name: Intubation Date/Time: 07/05/2021 7:34 AM Performed by: Lynden Oxford, CRNA Pre-anesthesia Checklist: Patient identified, Emergency Drugs available, Suction available and Patient being monitored Patient Re-evaluated:Patient Re-evaluated prior to induction Oxygen Delivery Method: Circle system utilized Preoxygenation: Pre-oxygenation with 100% oxygen Induction Type: IV induction Ventilation: Mask ventilation without difficulty Laryngoscope Size: McGraph and 3 Grade View: Grade I Tube type: Oral Tube size: 7.0 mm Number of attempts: 1 Airway Equipment and Method: Stylet and Video-laryngoscopy Placement Confirmation: ETT inserted through vocal cords under direct vision, positive ETCO2 and breath sounds checked- equal and bilateral Secured at: 20 cm Tube secured with: Tape Dental Injury: Teeth and Oropharynx as per pre-operative assessment

## 2021-07-05 NOTE — Interval H&P Note (Signed)
History and Physical Interval Note:  07/05/2021 7:04 AM  Lisa Strickland  has presented today for surgery, with the diagnosis of R10.13 epigastric pain.  The various methods of treatment have been discussed with the patient and family. After consideration of risks, benefits and other options for treatment, the patient has consented to  Procedure(s): XI ROBOTIC ASSISTED LAPAROSCOPIC CHOLECYSTECTOMY (N/A) as a surgical intervention.  The patient's history has been reviewed, patient examined, no change in status, stable for surgery.  I have reviewed the patient's chart and labs.  Questions were answered to the patient's satisfaction.     Cylas Falzone Tonna Boehringer

## 2021-07-05 NOTE — Op Note (Signed)
Preoperative diagnosis:  chronic and cholecystitis  Postoperative diagnosis: same as above  Procedure: Robotic assisted Laparoscopic Cholecystectomy.   Anesthesia: GETA   Surgeon: Sung Amabile  Specimen: Gallbladder  Complications: None  EBL: 56mL  Wound Classification: Clean Contaminated  Indications: see HPI  Findings: Critical view of safety noted Cystic duct and artery identified, ligated and divided, clips remained intact at end of procedure Adequate hemostasis  Description of procedure:  The patient was placed on the operating table in the supine position. SCDs placed, pre-op abx administered.  General anesthesia was induced and OG tube placed by anesthesia. A time-out was completed verifying correct patient, procedure, site, positioning, and implant(s) and/or special equipment prior to beginning this procedure. The abdomen was prepped and draped in the usual sterile fashion.    Veress needle was placed at the Palmer's point and insufflation was started after confirming a positive saline drop test and no immediate increase in abdominal pressure.  After reaching 15 mm, the Veress needle was removed and a 8 mm port was placed via optiview technique under umbilicus measured 8mm from gallbladder.  The abdomen was inspected and no abnormalities or injuries were found.  Under direct vision, ports were placed in the following locations: One 12 mm patient left of the umbilicus, 8cm from the optiviewed port, one 8 mm port placed to the patient right of the umbilical port 8 cm apart.  1 additional 8 mm port placed lateral to the 54mm port.  Once ports were placed, The table was placed in the reverse Trendelenburg position with the right side up. The Xi platform was brought into the operative field and docked to the ports successfully.  An endoscope was placed through the umbilical port, fenestrated grasper through the adjacent patient right port, prograsp to the far patient left port, and  then a hook cautery in the left port.  The dome of the gallbladder was grasped with prograsp, passed and retracted over the dome of the liver. Adhesions between the gallbladder and omentum, duodenum and transverse colon were lysed via hook cautery. The infundibulum was grasped with the fenestrated grasper and retracted toward the right lower quadrant. This maneuver exposed Calot's triangle. The peritoneum overlying the gallbladder infundibulum was then dissected  and the cystic duct and cystic artery identified.  Critical view of safety with the liver bed clearly visible behind the duct and artery with no additional structures noted.  The cystic duct and cystic artery clipped and divided close to the gallbladder.     The gallbladder was then dissected from its peritoneal and liver bed attachments by electrocautery. Clip on gallbladder side came off during this portion, requiring irrigation and suctioning some spilled contents. Hemostasis was checked prior to removing the hook cautery and the Endo Catch bag was then placed through the 12 mm port and the gallbladder was removed.  The gallbladder was passed off the table as a specimen. There was no evidence of bleeding from the gallbladder fossa or cystic artery or leakage of the bile from the cystic duct stump. The 12 mm port site closed with PMI using 0 vicryl under direct vision.  Abdomen desufflated and secondary trocars were removed under direct vision. No bleeding was noted. All skin incisions then closed with subcuticular sutures of 4-0 monocryl and dressed with topical skin adhesive. The orogastric tube was removed and patient extubated.  The patient tolerated the procedure well and was taken to the postanesthesia care unit in stable condition.  All sponge and instrument count  correct at end of procedure.

## 2021-07-05 NOTE — Anesthesia Preprocedure Evaluation (Addendum)
Anesthesia Evaluation  Patient identified by MRN, date of birth, ID band Patient awake    Reviewed: Allergy & Precautions, NPO status , Patient's Chart, lab work & pertinent test results, reviewed documented beta blocker date and time   History of Anesthesia Complications Negative for: history of anesthetic complications  Airway Mallampati: I  TM Distance: >3 FB Neck ROM: Full    Dental no notable dental hx.    Pulmonary    Pulmonary exam normal        Cardiovascular Exercise Tolerance: Good Normal cardiovascular examI     Neuro/Psych    GI/Hepatic GERD  ,  Endo/Other    Renal/GU      Musculoskeletal   Abdominal Normal abdominal exam  (+)   Peds  Hematology   Anesthesia Other Findings   Reproductive/Obstetrics                            Anesthesia Physical Anesthesia Plan  ASA: 1  Anesthesia Plan: General   Post-op Pain Management:    Induction: Intravenous  PONV Risk Score and Plan:   Airway Management Planned: Oral ETT  Additional Equipment:   Intra-op Plan:   Post-operative Plan: Extubation in OR  Informed Consent: I have reviewed the patients History and Physical, chart, labs and discussed the procedure including the risks, benefits and alternatives for the proposed anesthesia with the patient or authorized representative who has indicated his/her understanding and acceptance.       Plan Discussed with: CRNA  Anesthesia Plan Comments:         Anesthesia Quick Evaluation

## 2021-07-05 NOTE — Anesthesia Postprocedure Evaluation (Signed)
Anesthesia Post Note  Patient: Lisa Strickland  Procedure(s) Performed: XI ROBOTIC ASSISTED LAPAROSCOPIC CHOLECYSTECTOMY (Abdomen)  Patient location during evaluation: PACU Anesthesia Type: General Level of consciousness: awake and alert Pain management: pain level controlled Vital Signs Assessment: post-procedure vital signs reviewed and stable Respiratory status: spontaneous breathing, nonlabored ventilation, respiratory function stable and patient connected to nasal cannula oxygen Cardiovascular status: blood pressure returned to baseline and stable Postop Assessment: no apparent nausea or vomiting Anesthetic complications: no   No notable events documented.   Last Vitals:  Vitals:   07/05/21 0852 07/05/21 0900  BP: (!) 112/53 (!) 100/54  Pulse: (!) 54   Resp: 16   Temp: (!) 36.1 C   SpO2: 100%     Last Pain:  Vitals:   07/05/21 0900  TempSrc:   PainSc: 0-No pain                 [name] Michae Kava

## 2021-07-05 NOTE — Transfer of Care (Signed)
Immediate Anesthesia Transfer of Care Note  Patient: Lisa Strickland  Procedure(s) Performed: XI ROBOTIC ASSISTED LAPAROSCOPIC CHOLECYSTECTOMY (Abdomen)  Patient Location: PACU  Anesthesia Type:General  Level of Consciousness: drowsy  Airway & Oxygen Therapy: Patient Spontanous Breathing and Patient connected to face mask oxygen  Post-op Assessment: Report given to RN and Post -op Vital signs reviewed and stable  Post vital signs: Reviewed and stable  Last Vitals:  Vitals Value Taken Time  BP 112/53 07/05/21 0852  Temp    Pulse 51 07/05/21 0855  Resp 12 07/05/21 0855  SpO2 100 % 07/05/21 0855  Vitals shown include unvalidated device data.  Last Pain:  Vitals:   07/05/21 0649  TempSrc: Oral  PainSc: 0-No pain         Complications: No notable events documented.

## 2021-07-06 LAB — SURGICAL PATHOLOGY

## 2021-12-17 ENCOUNTER — Other Ambulatory Visit: Payer: Self-pay | Admitting: Nurse Practitioner

## 2021-12-17 DIAGNOSIS — Z1231 Encounter for screening mammogram for malignant neoplasm of breast: Secondary | ICD-10-CM

## 2022-01-31 ENCOUNTER — Ambulatory Visit
Admission: RE | Admit: 2022-01-31 | Discharge: 2022-01-31 | Disposition: A | Discharge status: Home / Self Care | Payer: Managed Care, Other (non HMO) | Source: Ambulatory Visit | Attending: Nurse Practitioner | Admitting: Nurse Practitioner

## 2022-01-31 DIAGNOSIS — Z1231 Encounter for screening mammogram for malignant neoplasm of breast: Secondary | ICD-10-CM | POA: Diagnosis present

## 2022-02-06 ENCOUNTER — Other Ambulatory Visit: Payer: Self-pay | Admitting: *Deleted

## 2022-02-06 ENCOUNTER — Inpatient Hospital Stay
Admission: RE | Admit: 2022-02-06 | Discharge: 2022-02-06 | Disposition: A | Discharge status: Home / Self Care | Payer: Self-pay | Source: Ambulatory Visit | Attending: *Deleted | Admitting: *Deleted

## 2022-02-06 DIAGNOSIS — Z1231 Encounter for screening mammogram for malignant neoplasm of breast: Secondary | ICD-10-CM

## 2022-11-29 ENCOUNTER — Other Ambulatory Visit: Payer: Self-pay | Admitting: Nurse Practitioner

## 2022-11-29 DIAGNOSIS — N644 Mastodynia: Secondary | ICD-10-CM

## 2022-12-03 ENCOUNTER — Other Ambulatory Visit: Payer: Self-pay | Admitting: Nurse Practitioner

## 2022-12-03 DIAGNOSIS — N644 Mastodynia: Secondary | ICD-10-CM

## 2022-12-06 ENCOUNTER — Other Ambulatory Visit: Payer: Managed Care, Other (non HMO)

## 2022-12-11 ENCOUNTER — Ambulatory Visit
Admission: RE | Admit: 2022-12-11 | Discharge: 2022-12-11 | Disposition: A | Discharge status: Home / Self Care | Payer: Managed Care, Other (non HMO) | Source: Ambulatory Visit | Attending: Nurse Practitioner | Admitting: Nurse Practitioner

## 2022-12-11 DIAGNOSIS — N644 Mastodynia: Secondary | ICD-10-CM

## 2023-03-21 ENCOUNTER — Other Ambulatory Visit: Payer: Self-pay

## 2023-03-22 MED ORDER — ROSUVASTATIN CALCIUM 10 MG PO TABS: 10.0000 mg | ORAL_TABLET | Freq: Every day | ORAL | 3 refills | Status: DC

## 2023-04-04 ENCOUNTER — Other Ambulatory Visit: Payer: Self-pay | Admitting: Family

## 2023-04-04 MED ORDER — ROSUVASTATIN CALCIUM 10 MG PO TABS: 10.0000 mg | ORAL_TABLET | Freq: Every day | ORAL | 3 refills | Status: DC

## 2023-05-07 ENCOUNTER — Encounter: Payer: Self-pay | Admitting: Family

## 2023-05-07 ENCOUNTER — Ambulatory Visit
Admission: RE | Admit: 2023-05-07 | Discharge: 2023-05-07 | Disposition: A | Discharge status: Home / Self Care | Payer: Managed Care, Other (non HMO) | Attending: Family | Admitting: Family

## 2023-05-07 ENCOUNTER — Ambulatory Visit: Payer: Managed Care, Other (non HMO) | Admitting: Family

## 2023-05-07 ENCOUNTER — Ambulatory Visit
Admission: RE | Admit: 2023-05-07 | Discharge: 2023-05-07 | Disposition: A | Discharge status: Home / Self Care | Payer: Managed Care, Other (non HMO) | Source: Ambulatory Visit | Attending: Family | Admitting: Family

## 2023-05-07 VITALS — BP 108/62 | HR 64 | Ht 65.0 in | Wt 166.8 lb

## 2023-05-07 DIAGNOSIS — R7303 Prediabetes: Secondary | ICD-10-CM | POA: Diagnosis not present

## 2023-05-07 DIAGNOSIS — E782 Mixed hyperlipidemia: Secondary | ICD-10-CM | POA: Diagnosis not present

## 2023-05-07 DIAGNOSIS — Z1501 Genetic susceptibility to malignant neoplasm of breast: Secondary | ICD-10-CM | POA: Diagnosis not present

## 2023-05-07 DIAGNOSIS — M25562 Pain in left knee: Secondary | ICD-10-CM | POA: Diagnosis present

## 2023-05-07 DIAGNOSIS — G8929 Other chronic pain: Secondary | ICD-10-CM | POA: Insufficient documentation

## 2023-05-07 DIAGNOSIS — N644 Mastodynia: Secondary | ICD-10-CM

## 2023-05-07 DIAGNOSIS — M25561 Pain in right knee: Secondary | ICD-10-CM | POA: Diagnosis not present

## 2023-05-07 DIAGNOSIS — E538 Deficiency of other specified B group vitamins: Secondary | ICD-10-CM

## 2023-05-07 DIAGNOSIS — I959 Hypotension, unspecified: Secondary | ICD-10-CM

## 2023-05-07 DIAGNOSIS — R5383 Other fatigue: Secondary | ICD-10-CM

## 2023-05-07 DIAGNOSIS — E559 Vitamin D deficiency, unspecified: Secondary | ICD-10-CM

## 2023-05-07 MED ORDER — GUANFACINE HCL ER 1 MG PO TB24: 2.0000 mg | ORAL_TABLET | Freq: Every day | ORAL | 1 refills | Status: DC

## 2023-05-14 ENCOUNTER — Telehealth: Payer: Self-pay

## 2023-05-18 ENCOUNTER — Encounter: Payer: Self-pay | Admitting: Family

## 2023-05-18 DIAGNOSIS — G8929 Other chronic pain: Secondary | ICD-10-CM | POA: Insufficient documentation

## 2023-05-18 NOTE — Progress Notes (Signed)
Established Patient Office Visit  Subjective:  Patient ID: Lisa Strickland, female    DOB: Jul 17, 1976  Age: 47 y.o. MRN: [id number]  Chief Complaint  Patient presents with   Knee Pain    Pain in both knees and both knees have been giving out while walking up the stairs .    Patient is here today with multiple concerns.   1) She has had chronic knee pain, but it has worsened, says that she has now had her knees collapse on her when going up stairs.   Knee Pain  There was no injury mechanism (started after patient gave birth). The pain is present in the right knee and left knee. The quality of the pain is described as aching. The pain has been Fluctuating since onset. Associated symptoms include an inability to bear weight. Associated symptoms comments: Difficulty climbing stairs. She reports no foreign bodies present. The symptoms are aggravated by movement and weight bearing. She has tried acetaminophen, NSAIDs, non-weight bearing, ice, rest, heat and elevation for the symptoms. The treatment provided mild relief.   2) She has a very strong family history of breast cancer and asks if we can run a panel to see if she has any genetic risks herself.   3) She asks if we can check her hormone levels, as she is concerned about possibly hormonal changes/menopausal changes. She has been having bilateral breast pain, and is wondering if it could be related to this.   No other concerns at this time.   Past Medical History:  Diagnosis Date   GERD (gastroesophageal reflux disease)    History of hiatal hernia    Medical history non-contributory    Vaginal Pap smear, abnormal     Past Surgical History:  Procedure Laterality Date   BUNIONECTOMY     CESAREAN SECTION N/A 12/13/2017   Procedure: CESAREAN SECTION;  Surgeon: Vick Frees, MD;  Location: [facility] BIRTHING SUITES;  Service: Obstetrics;  Laterality: N/A;   CESAREAN SECTION WITH BILATERAL TUBAL LIGATION Bilateral 05/18/2020   Procedure:  CESAREAN SECTION WITH BILATERAL TUBAL LIGATION By Salpingectomy;  Surgeon: Noland Fordyce, MD;  Location: [facility] LD ORS;  Service: Obstetrics;  Laterality: Bilateral;  EDD: 05/24/20   CYSTECTOMY     GYNECOLOGIC CRYOSURGERY      Social History   Socioeconomic History   Marital status: Married    Spouse name: Not on file   Number of children: Not on file   Years of education: Not on file   Highest education level: Not on file  Occupational History   Not on file  Tobacco Use   Smoking status: Never   Smokeless tobacco: Never  Vaping Use   Vaping status: Never Used  Substance and Sexual Activity   Alcohol use: No   Drug use: No   Sexual activity: Not on file  Other Topics Concern   Not on file  Social History Narrative   Not on file   Social Determinants of Health   Financial Resource Strain: Not on file  Food Insecurity: Not on file  Transportation Needs: Not on file  Physical Activity: Not on file  Stress: Not on file  Social Connections: Not on file  Intimate Partner Violence: Not on file    Family History  Problem Relation Age of Onset   Thrombosis Father    Breast cancer Paternal Aunt    Colon cancer Paternal Aunt     Allergies  Allergen Reactions   Sulfa Antibiotics Rash  Review of Systems  Musculoskeletal:  Positive for joint pain (bilateral knees).  Endo/Heme/Allergies:        Breast Pain bilaterallly  All other systems reviewed and are negative.      Objective:   BP 108/62   Pulse 64   Ht 5\' 5"  (1.651 m)   Wt 166 lb 12.8 oz (75.7 kg)   LMP 04/14/2023   SpO2 99%   BMI 27.76 kg/m   Vitals:   05/07/23 0930  BP: 108/62  Pulse: 64  Height: 5\' 5"  (1.651 m)  Weight: 166 lb 12.8 oz (75.7 kg)  SpO2: 99%  BMI (Calculated): 27.76    Physical Exam Vitals and nursing note reviewed.  Constitutional:      Appearance: Normal appearance. She is normal weight.  HENT:     Head: Normocephalic.  Eyes:     Extraocular Movements: Extraocular  movements intact.     Conjunctiva/sclera: Conjunctivae normal.     Pupils: Pupils are equal, round, and reactive to light.  Cardiovascular:     Rate and Rhythm: Normal rate.  Pulmonary:     Effort: Pulmonary effort is normal.  Neurological:     General: No focal deficit present.     Mental Status: She is alert and oriented to person, place, and time. Mental status is at baseline.  Psychiatric:        Mood and Affect: Mood normal.        Behavior: Behavior normal.        Thought Content: Thought content normal.        Judgment: Judgment normal.      No results found for any visits on 05/07/23.  No results found for this or any previous visit (from the past 2160 hour(s)).     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    -  Primary   Relevant Orders   BRCAssure Comprehensive Panel   CBC With Differential   CMP14+EGFR   Chronic pain of both knees       Relevant Orders   DG Knee Complete 4 Views Left (Completed)   DG Knee Complete 4 Views Right (Completed)   CBC With Differential   CMP14+EGFR   Prediabetes       Relevant Orders   CBC With Differential   CMP14+EGFR   Hemoglobin A1c   B12 deficiency due to diet       Relevant Orders   CBC With Differential   CMP14+EGFR   Vitamin B12   Other fatigue       Relevant Orders   CBC With Differential   CMP14+EGFR   TSH   FSH+Prog+E2+SHBG   Vitamin D deficiency, unspecified       Relevant Orders   VITAMIN D 25 Hydroxy (Vit-D Deficiency, Fractures)   CBC With Differential   CMP14+EGFR   Mixed hyperlipidemia       Relevant Orders   Lipid panel   CBC With Differential   CMP14+EGFR   Hypotension, unspecified hypotension type       Relevant Orders   CBC With Differential   CMP14+EGFR   Bilateral mastodynia       Relevant Orders   CBC With Differential   CMP14+EGFR   FSH+Prog+E2+SHBG       Return in about 2  weeks (around 05/21/2023) for F/U.   Total time spent: 30 minutes  Byrdie Miyazaki Daine Gravel, FNP  05/07/2023   This document may have been prepared by Dragon Voice Recognition software and as such may include unintentional dictation errors.

## 2023-05-20 ENCOUNTER — Encounter: Payer: Self-pay | Admitting: Family

## 2023-05-20 ENCOUNTER — Ambulatory Visit: Payer: Managed Care, Other (non HMO) | Admitting: Family

## 2023-05-20 VITALS — BP 100/70 | HR 78 | Ht 65.0 in | Wt 168.0 lb

## 2023-05-20 DIAGNOSIS — Z800 Family history of malignant neoplasm of digestive organs: Secondary | ICD-10-CM

## 2023-05-20 DIAGNOSIS — M25562 Pain in left knee: Secondary | ICD-10-CM | POA: Diagnosis not present

## 2023-05-20 DIAGNOSIS — G8929 Other chronic pain: Secondary | ICD-10-CM

## 2023-05-20 DIAGNOSIS — Z803 Family history of malignant neoplasm of breast: Secondary | ICD-10-CM

## 2023-05-20 DIAGNOSIS — M25561 Pain in right knee: Secondary | ICD-10-CM | POA: Diagnosis not present

## 2023-05-20 DIAGNOSIS — Z1501 Genetic susceptibility to malignant neoplasm of breast: Secondary | ICD-10-CM

## 2023-05-20 NOTE — Progress Notes (Signed)
Established Patient Office Visit  Subjective:  Patient ID: Lisa Strickland, female    DOB: 1976-01-02  Age: 47 y.o. MRN: [id number]  Chief Complaint  Patient presents with   Follow-up    2 week f/u    Here for her 2 week f/u.  We have gotten some of her results back: 1) Hormone levels look good.  2) The rest of her labs look good in general.   We do not have any of the genetic tests back at this point, but she has heard from a Dentist.  She wanted the test that is for multiple types of hereditary cancer.   The x-rays of her knees did not show any abnormalities, so we can get her set up for physical therapy, which would be the next step before we are able to get an MRI.   No other concerns today.  No other concerns at this time.   Past Medical History:  Diagnosis Date   GERD (gastroesophageal reflux disease)    History of hiatal hernia    Medical history non-contributory    Vaginal Pap smear, abnormal     Past Surgical History:  Procedure Laterality Date   BUNIONECTOMY     CESAREAN SECTION N/A 12/13/2017   Procedure: CESAREAN SECTION;  Surgeon: Vick Frees, MD;  Location: [facility] BIRTHING SUITES;  Service: Obstetrics;  Laterality: N/A;   CESAREAN SECTION WITH BILATERAL TUBAL LIGATION Bilateral 05/18/2020   Procedure: CESAREAN SECTION WITH BILATERAL TUBAL LIGATION By Salpingectomy;  Surgeon: Noland Fordyce, MD;  Location: [facility] LD ORS;  Service: Obstetrics;  Laterality: Bilateral;  EDD: 05/24/20   CYSTECTOMY     GYNECOLOGIC CRYOSURGERY      Social History   Socioeconomic History   Marital status: Married    Spouse name: Not on file   Number of children: Not on file   Years of education: Not on file   Highest education level: Not on file  Occupational History   Not on file  Tobacco Use   Smoking status: Never   Smokeless tobacco: Never  Vaping Use   Vaping status: Never Used  Substance and Sexual Activity   Alcohol use: No   Drug use: No   Sexual  activity: Not on file  Other Topics Concern   Not on file  Social History Narrative   Not on file   Social Determinants of Health   Financial Resource Strain: Not on file  Food Insecurity: Not on file  Transportation Needs: Not on file  Physical Activity: Not on file  Stress: Not on file  Social Connections: Not on file  Intimate Partner Violence: Not on file    Family History  Problem Relation Age of Onset   Thrombosis Father    Breast cancer Paternal Aunt 36   Colon cancer Paternal Aunt 54   Breast cancer Paternal Aunt 58   Liver cancer Paternal Aunt 15   Breast cancer Cousin     Allergies  Allergen Reactions   Sulfa Antibiotics Rash    Review of Systems  All other systems reviewed and are negative.      Objective:   BP 100/70   Pulse 78   Ht 5\' 5"  (1.651 m)   Wt 168 lb (76.2 kg)   LMP 04/14/2023   SpO2 96%   BMI 27.96 kg/m   Vitals:   05/20/23 1003  BP: 100/70  Pulse: 78  Height: 5\' 5"  (1.651 m)  Weight: 168 lb (76.2 kg)  SpO2: 96%  BMI (Calculated): 27.96    Physical Exam Vitals and nursing note reviewed.  Constitutional:      General: She is awake.     Appearance: Normal appearance. She is normal weight.  HENT:     Head: Normocephalic.  Eyes:     Pupils: Pupils are equal, round, and reactive to light.  Cardiovascular:     Rate and Rhythm: Normal rate.  Pulmonary:     Effort: Pulmonary effort is normal.  Neurological:     General: No focal deficit present.     Mental Status: She is alert and oriented to person, place, and time. Mental status is at baseline.  Psychiatric:        Attention and Perception: Attention and perception normal.        Mood and Affect: Affect normal. Mood is anxious.        Speech: Speech normal.        Behavior: Behavior normal. Behavior is cooperative.        Thought Content: Thought content normal.        Cognition and Memory: Cognition and memory normal.        Judgment: Judgment normal.      No  results found for any visits on 05/20/23.  Recent Results (from the past 2160 hour(s))  Lipid panel     Status: None   Collection Time: 05/07/23 10:19 AM  Result Value Ref Range   Cholesterol, Total 124 100 - 199 mg/dL   Triglycerides 64 0 - 149 mg/dL   HDL 46 >60 mg/dL   VLDL Cholesterol Cal 14 5 - 40 mg/dL   LDL Chol Calc (NIH) 64 0 - 99 mg/dL   Chol/HDL Ratio 2.7 0.0 - 4.4 ratio    Comment:                                   T. Chol/HDL Ratio                                             Men  Women                               1/2 Avg.Risk  3.4    3.3                                   Avg.Risk  5.0    4.4                                2X Avg.Risk  9.6    7.1                                3X Avg.Risk 23.4   11.0   VITAMIN D 25 Hydroxy (Vit-D Deficiency, Fractures)     Status: None   Collection Time: 05/07/23 10:19 AM  Result Value Ref Range   Vit D, 25-Hydroxy 33.1 30.0 - 100.0 ng/mL    Comment: Vitamin D deficiency has been defined by the Institute of Medicine and an Endocrine Society practice guideline as  a level of serum 25-OH vitamin D less than 20 ng/mL (1,2). The Endocrine Society went on to further define vitamin D insufficiency as a level between 21 and 29 ng/mL (2). 1. IOM (Institute of Medicine). 2010. Dietary reference    intakes for calcium and D. [place]: The    [employer]. 2. Holick MF, Binkley Sherrill, Bischoff-Ferrari HA, et al.    Evaluation, treatment, and prevention of vitamin D    deficiency: an Endocrine Society clinical practice    guideline. JCEM. 2011 Jul; 96(7):1911-30.   CBC With Differential     Status: Abnormal   Collection Time: 05/07/23 10:19 AM  Result Value Ref Range   WBC 5.9 3.4 - 10.8 x10E3/uL   RBC 4.13 3.77 - 5.28 x10E6/uL   Hemoglobin 11.7 11.1 - 15.9 g/dL   Hematocrit 40.9 81.1 - 46.6 %   MCV 91 79 - 97 fL   MCH 28.3 26.6 - 33.0 pg   MCHC 31.3 (L) 31.5 - 35.7 g/dL   RDW 91.4 78.2 - 95.6 %   Neutrophils 52 Not  Estab. %   Lymphs 36 Not Estab. %   Monocytes 9 Not Estab. %   Eos 2 Not Estab. %   Basos 1 Not Estab. %   Neutrophils Absolute 3.1 1.4 - 7.0 x10E3/uL   Lymphocytes Absolute 2.1 0.7 - 3.1 x10E3/uL   Monocytes Absolute 0.6 0.1 - 0.9 x10E3/uL   EOS (ABSOLUTE) 0.1 0.0 - 0.4 x10E3/uL   Basophils Absolute 0.0 0.0 - 0.2 x10E3/uL   Immature Granulocytes 0 Not Estab. %   Immature Grans (Abs) 0.0 0.0 - 0.1 x10E3/uL    Comment: **Effective May 19, 2023, profile [id number] CBC/Differential**   (No Platelet) will be made non-orderable. [facility] Offers:   [id number] CBC With Differential/Platelet   CMP14+EGFR     Status: Abnormal   Collection Time: 05/07/23 10:19 AM  Result Value Ref Range   Glucose 79 70 - 99 mg/dL   BUN 17 6 - 24 mg/dL   Creatinine, Ser 5.78 0.57 - 1.00 mg/dL   eGFR 469 >62 XB/MWU/1.32   BUN/Creatinine Ratio 24 (H) 9 - 23   Sodium 139 134 - 144 mmol/L   Potassium 4.4 3.5 - 5.2 mmol/L   Chloride 105 96 - 106 mmol/L   CO2 21 20 - 29 mmol/L   Calcium 9.1 8.7 - 10.2 mg/dL   Total Protein 6.7 6.0 - 8.5 g/dL   Albumin 4.3 3.9 - 4.9 g/dL   Globulin, Total 2.4 1.5 - 4.5 g/dL   Bilirubin Total 0.2 0.0 - 1.2 mg/dL   Alkaline Phosphatase 62 44 - 121 IU/L   AST 21 0 - 40 IU/L   ALT 12 0 - 32 IU/L  TSH     Status: None   Collection Time: 05/07/23 10:19 AM  Result Value Ref Range   TSH 1.270 0.450 - 4.500 uIU/mL  Hemoglobin A1c     Status: None   Collection Time: 05/07/23 10:19 AM  Result Value Ref Range   Hgb A1c MFr Bld 5.5 4.8 - 5.6 %    Comment:          Prediabetes: 5.7 - 6.4          Diabetes: >6.4          Glycemic control for adults with diabetes: <7.0    Est. average glucose Bld gHb Est-mCnc 111 mg/dL  Vitamin G40     Status: None   Collection Time: 05/07/23 10:19 AM  Result Value Ref Range   Vitamin B-12 324 232 - 1,245 pg/mL  FSH+Prog+E2+SHBG     Status: None   Collection Time: 05/07/23 10:19 AM  Result Value Ref Range   FSH 4.5 mIU/mL    Comment:                       Adult Female             Range                       Follicular phase      3.5 -  12.5                       Ovulation phase       4.7 -  21.5                       Luteal phase          1.7 -   7.7                       Postmenopausal       25.8 - 134.8    Progesterone 10.3 ng/mL    Comment:                      Follicular phase       0.1 -   0.9                      Luteal phase           1.8 -  23.9                      Ovulation phase        0.1 -  12.0                      Pregnant                         First trimester    11.0 -  44.3                         Second trimester   25.4 -  83.3                         Third trimester    58.7 - 214.0                      Postmenopausal         0.0 -   0.1    Sex Hormone Binding 42.7 24.6 - 122.0 nmol/L   Estradiol 105.0 pg/mL    Comment:                      Adult Female             Range                       Follicular phase     12.5 - 166.0                       Ovulation phase  85.8 - 498.0                       Luteal phase         43.8 - 211.0                       Postmenopausal       <6.0 -  54.7                      Pregnancy                       1st trimester     215.0 - >4300.0 Roche ECLIA methodology   Request Problem     Status: None   Collection Time: 05/07/23 10:19 AM  Result Value Ref Range   Request Problem Comment:     Comment: BRCAssure Comprehensive Panel    Test [phone] has been cancelled on 05/21/23. Please refer to specimen # [id number] [facility] Cancer Panel for BRCA results.        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  Patient has number to set up for genetic counseling and PA.   Relevant Orders   [facility]  Cancer w/o BRCA   Family history of breast cancer       Relevant Orders   [facility] Cancer w/o BRCA   Family history of liver cancer       Relevant Orders   [facility] Cancer w/o BRCA   Family history of colon cancer       Relevant Orders   [facility] Cancer w/o BRCA       Return once results are back..   Total time spent: 30 minutes  Miki Kins, FNP  05/20/2023   This document may have been prepared by [facility] Voice Recognition software and as such may include unintentional dictation errors.

## 2023-05-22 NOTE — Telephone Encounter (Signed)
Done @ pt appt

## 2023-05-30 ENCOUNTER — Encounter: Payer: Self-pay | Admitting: Family

## 2023-05-31 ENCOUNTER — Other Ambulatory Visit: Payer: Self-pay | Admitting: Family

## 2023-06-01 ENCOUNTER — Encounter: Payer: Self-pay | Admitting: Family

## 2023-06-05 ENCOUNTER — Other Ambulatory Visit: Payer: Self-pay | Admitting: Family

## 2023-06-05 MED ORDER — ESTRADIOL 10 MCG VA TABS: 1.0000 | ORAL_TABLET | VAGINAL | 3 refills | Status: DC

## 2023-07-10 LAB — VISTASEQ HERED. CANCER PANEL

## 2023-08-09 IMAGING — MG MM DIGITAL SCREENING BILAT W/ TOMO AND CAD
8 series · 8 of 24 positions shown · non-contrast
Comparison: Previous exam(s).

CLINICAL DATA: Screening.

EXAM:
DIGITAL SCREENING BILATERAL MAMMOGRAM WITH TOMOSYNTHESIS AND CAD
TECHNIQUE: Bilateral screening digital craniocaudal and mediolateral oblique
mammograms were obtained. Bilateral screening digital breast
tomosynthesis was performed. The images were evaluated with
computer-aided detection.

[R MLO synth-2D]
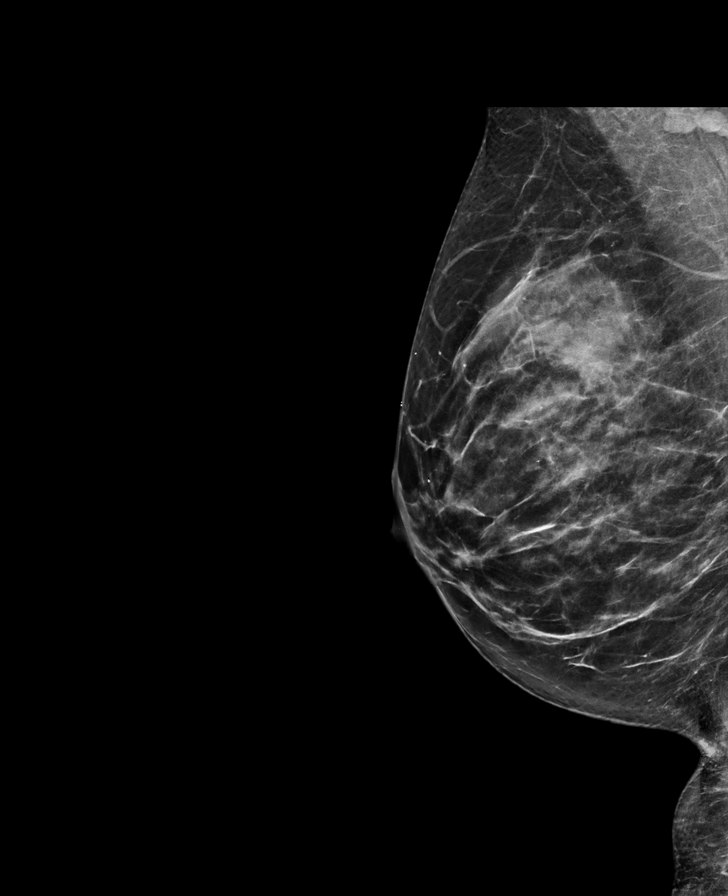

[L MLO synth-2D]
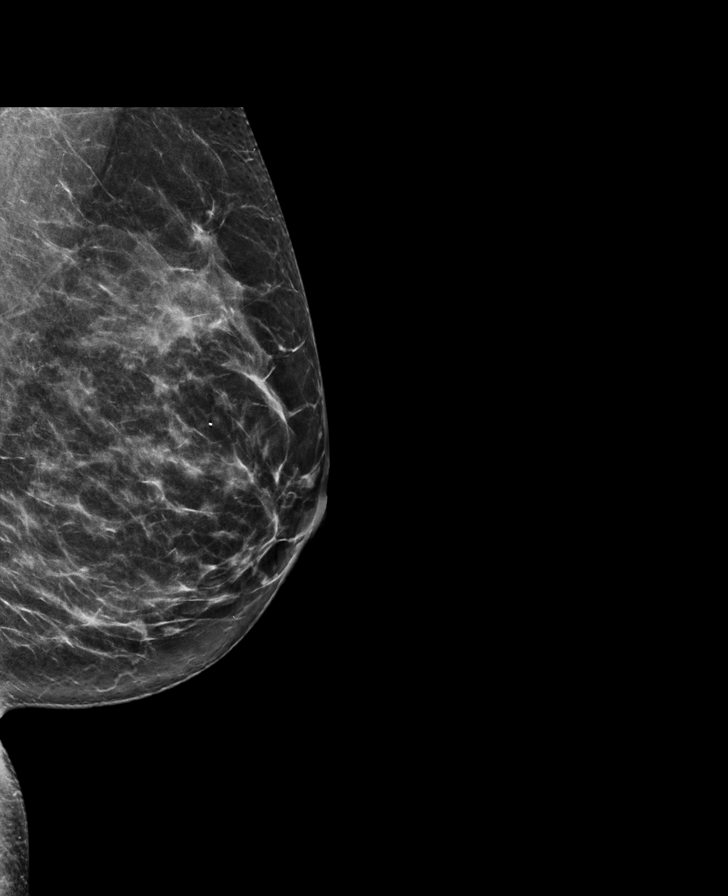

[R CC synth-2D]
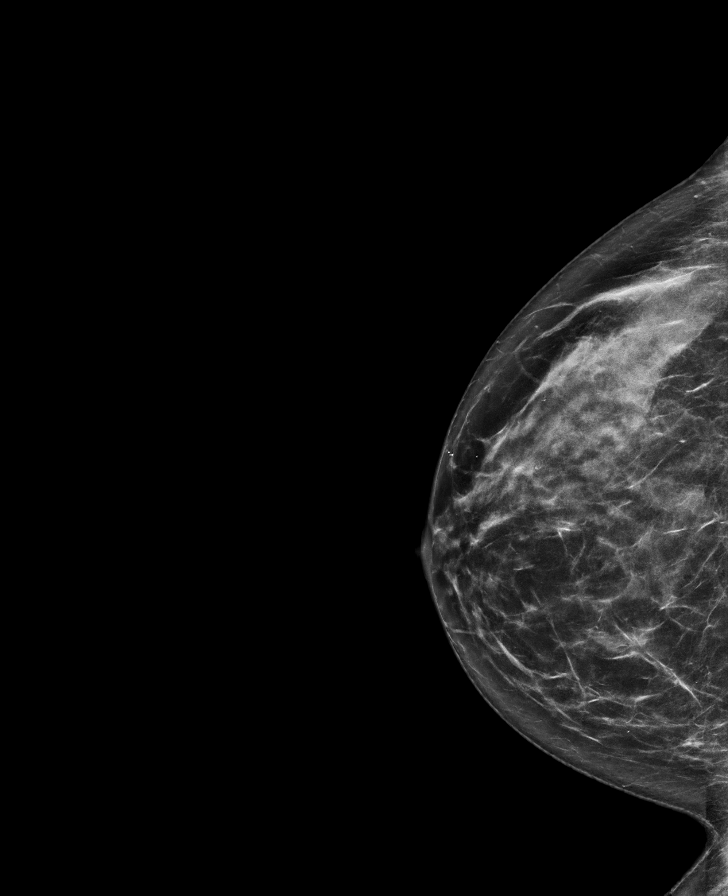

[L CC synth-2D]
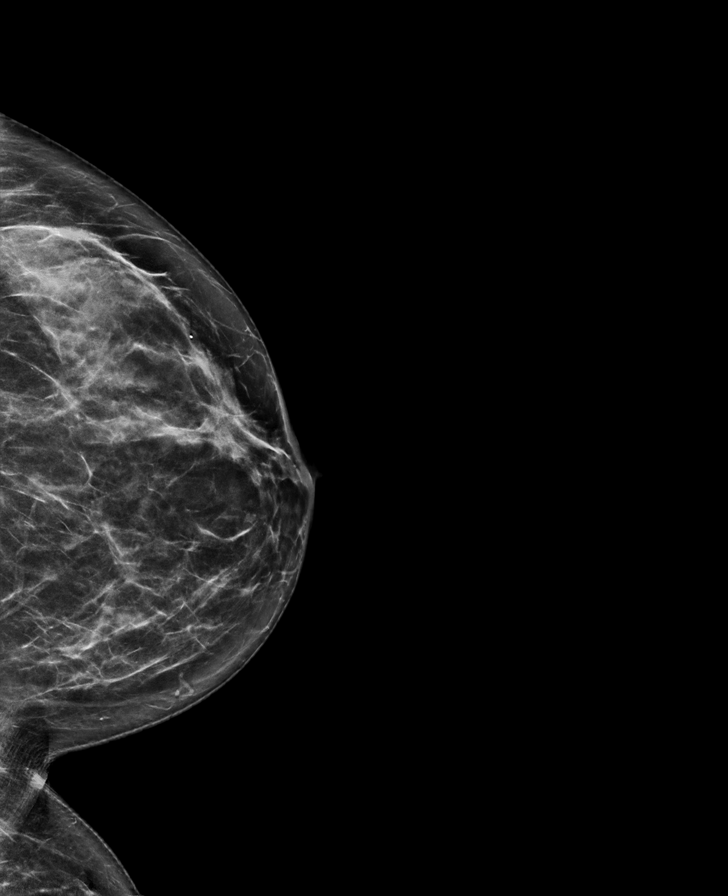

[L MLO tomo · tomo slice 35/68.0]
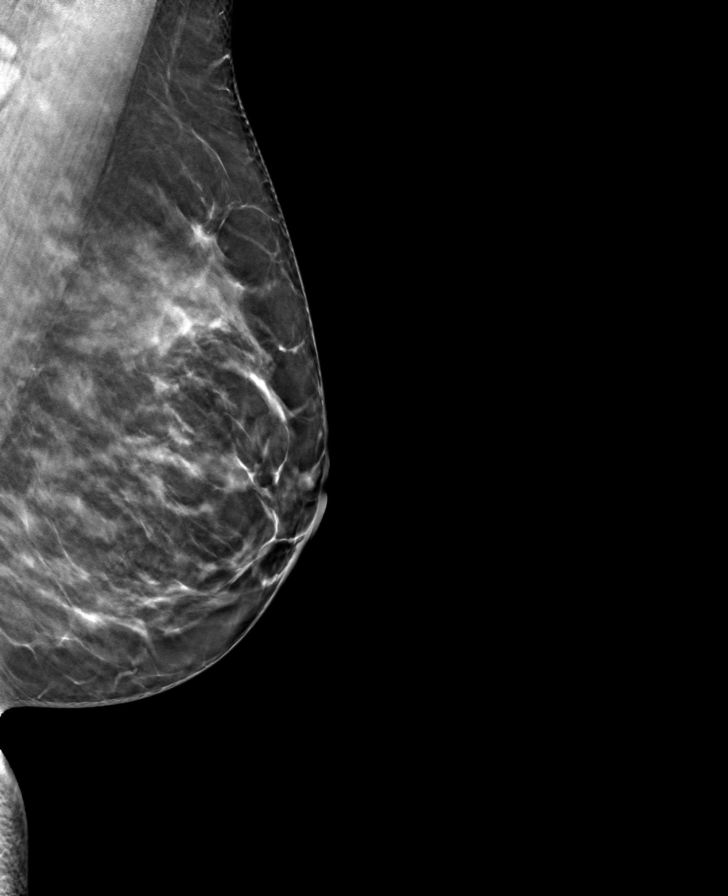

[L CC tomo · tomo slice 36/71.0]
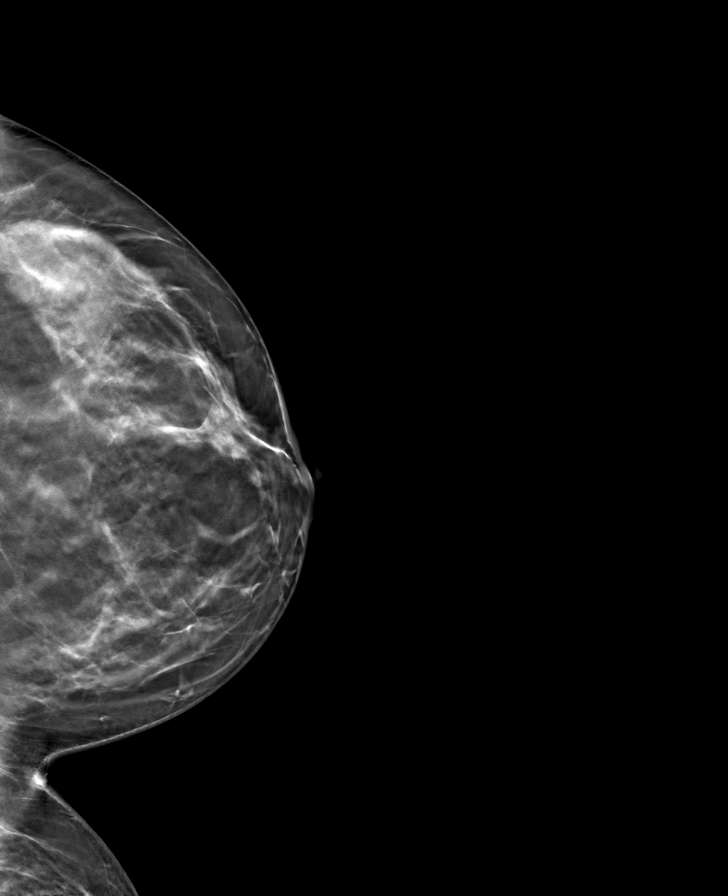

[R CC tomo · tomo slice 35/70.0]
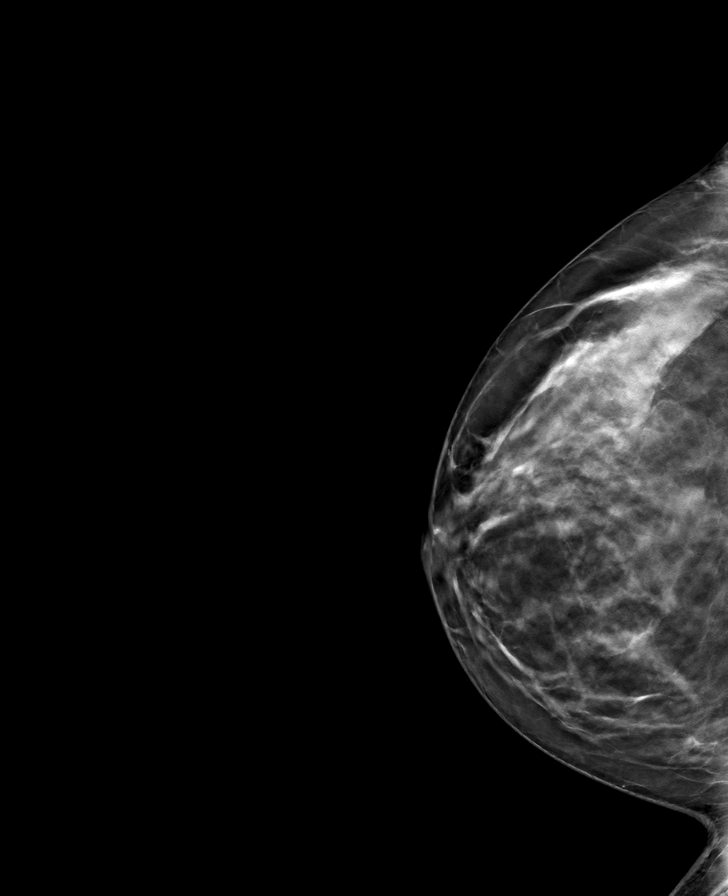

[R MLO tomo · tomo slice 35/70.0]
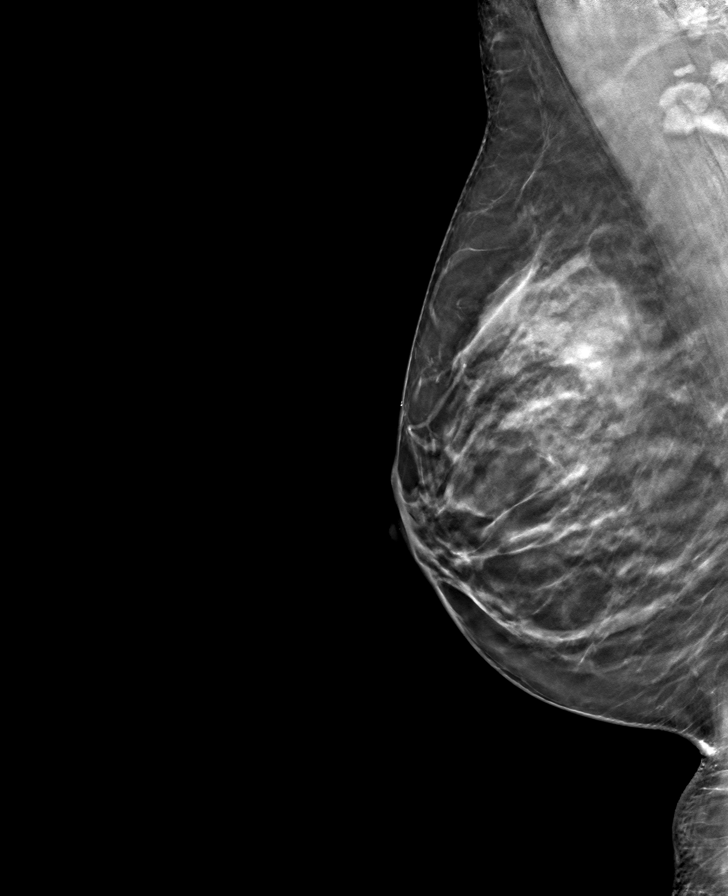

[8 of 24 positions shown; findings below may reference images not displayed]

ACR Breast Density Category c: The breast tissue is heterogeneously
dense, which may obscure small masses.
FINDINGS: There are no findings suspicious for malignancy.
IMPRESSION: No mammographic evidence of malignancy. A result letter of this
screening mammogram will be mailed directly to the patient.

RECOMMENDATION:
Screening mammogram in one year. (Code:Q3-W-BC3)

BI-RADS CATEGORY  1: Negative.

## 2023-08-19 ENCOUNTER — Ambulatory Visit: Payer: Managed Care, Other (non HMO)

## 2023-08-19 ENCOUNTER — Other Ambulatory Visit: Payer: Self-pay | Admitting: Physician Assistant

## 2023-08-19 DIAGNOSIS — M25561 Pain in right knee: Secondary | ICD-10-CM

## 2023-08-20 ENCOUNTER — Ambulatory Visit: Payer: Managed Care, Other (non HMO)

## 2023-11-19 NOTE — Progress Notes (Signed)
New patient visit   Patient: Lisa Strickland   DOB: 19-Jan-1976   48 y.o. Female  MRN: [id number] Visit Date: 11/20/2023  Today's healthcare provider: Ronnald Ramp, MD   Chief Complaint  Patient presents with   Establish Care    Cholesterol, hormones Depression--fatigue, rapid weight gained--more than year.    Subjective    Lisa Strickland is a 48 y.o. female who presents today as a new patient to establish care.   HPI     Establish Care    Additional comments: Cholesterol, hormones Depression--fatigue, rapid weight gained--more than year.       Last edited by Shelly Bombard, CMA on 11/20/2023  1:06 PM.       Discussed the use of AI scribe software for clinical note transcription with the patient, who gave verbal consent to proceed.  History of Present Illness   The patient presents for a new patient visit to establish care.  The patient has a history of depression and has tried several medications, including cipraletan, Wellbutrin, Adderall, and guanfacine, but experienced side effects such as disrupted sleep and constipation without significant improvement. She suspects she has ADHD based on online assessments but has not pursued formal evaluation due to cost concerns.  She reports a weight gain of 10 pounds over a two-month period last year, reaching 175 pounds, which is her heaviest weight. She does not exercise regularly but tries to maintain a balanced diet. She has been on estrogen therapy, which she finds difficult to maintain due to its form as a suppository. She has noted changes in her menstrual cycle, with periods becoming heavier and longer since her last child.  She experiences discomfort and tingling in both breasts, particularly on the right side near the armpit. A mammogram, which was not covered by insurance, returned normal results. She has changed her bras and tries to wear them less often, but the symptoms persist. She has a history of wearing  underwire bras almost constantly when younger.  She reports issues with her right knee, which started giving out when going up stairs. Physical therapy provided some relief, but she did not continue exercises on her own. Recently, she has experienced numbness and pain in the right knee, especially when standing. An x-ray showed a small amount of arthritis, and she takes meloxicam about once a week for relief.       Past Medical History:  Diagnosis Date   GERD (gastroesophageal reflux disease)    History of hiatal hernia    Medical history non-contributory    S/P tubal ligation 05/20/2020   Status post repeat low transverse cesarean section 7/29 05/18/2020   Vaginal Pap smear, abnormal     Outpatient Medications Prior to Visit  Medication Sig   Estradiol 10 MCG TABS vaginal tablet Place 1 tablet (10 mcg total) vaginally 2 (two) times a week.   hydrOXYzine (ATARAX) 25 MG tablet Take 25-50 mg by mouth at bedtime as needed.   meloxicam (MOBIC) 15 MG tablet TAKE 1 TABLET BY MOUTH EVERY DAY WITH A MEAL   rosuvastatin (CRESTOR) 10 MG tablet Take 1 tablet (10 mg total) by mouth at bedtime.   guanFACINE (INTUNIV) 1 MG TB24 ER tablet TAKE 2 TABLETS BY MOUTH EVERY DAY (Patient not taking: Reported on 11/20/2023)   No facility-administered medications prior to visit.    Past Surgical History:  Procedure Laterality Date   BUNIONECTOMY     CESAREAN SECTION N/A 12/13/2017   Procedure: CESAREAN SECTION;  Surgeon:  Vick Frees, MD;  Location: [facility] BIRTHING SUIT[facility];  Service: Obstetrics;  Laterality: N/A;   CESAREAN SECTION WITH BILATERAL TUBAL LIGATION Bilateral 05/18/2020   Procedure: CESAREAN SECTION WITH BILATERAL TUBAL LIGATION By Salpingectomy;  Surgeon: Noland Fordyce, MD;  Location: [facility] LD ORS;  Service: Obstetrics;  Laterality: Bilateral;  EDD: 05/24/20   CYSTECTOMY     GYNECOLOGIC CRYOSURGERY     Family Status  Relation Name Status   Father  Deceased   Pat Aunt Alona Bene Deceased   Pat Aunt  Hale Bogus Alive   Pat Aunt [place] Alive   [place] Deceased   Other  Alive   Cousin Rise Paganini, age 5y  No partnership data on file   Family History  Problem Relation Age of Onset   Thrombosis Father    Breast cancer Paternal Aunt 10   Colon cancer Paternal Aunt 46   Breast cancer Paternal Aunt 37   Liver cancer Paternal Aunt 36   Breast cancer Cousin    Social History   Socioeconomic History   Marital status: Married    Spouse name: Not on file   Number of children: Not on file   Years of education: Not on file   Highest education level: Master's degree (e.g., MA, MS, MEng, MEd, MSW, MBA)  Occupational History   Not on file  Tobacco Use   Smoking status: Never   Smokeless tobacco: Never  Vaping Use   Vaping status: Never Used  Substance and Sexual Activity   Alcohol use: No   Drug use: No   Sexual activity: Not on file  Other Topics Concern   Not on file  Social History Narrative   Not on file   Social Drivers of Health   Financial Resource Strain: Low Risk  (11/19/2023)   Overall Financial Resource Strain (CARDIA)    Difficulty of Paying Living Expenses: Not hard at all  Food Insecurity: No Food Insecurity (11/19/2023)   Hunger Vital Sign    Worried About Running Out of Food in the Last Year: Never true    Ran Out of Food in the Last Year: Never true  Transportation Needs: No Transportation Needs (11/19/2023)   PRAPARE - Administrator, Civil Service (Medical): No    Lack of Transportation (Non-Medical): No  Physical Activity: Unknown (11/19/2023)   Exercise Vital Sign    Days of Exercise per Week: 0 days    Minutes of Exercise per Session: Not on file  Stress: Stress Concern Present (11/19/2023)   [employer] of Occupational Health - Occupational Stress Questionnaire    Feeling of Stress : To some extent  Social Connections: Socially Integrated (11/19/2023)   Social Connection and Isolation Panel [NHANES]    Frequency of  Communication with Friends and Family: More than three times a week    Frequency of Social Gatherings with Friends and Family: Once a week    Attends Religious Services: 1 to 4 times per year    Active Member of [employer] or Organizations: Yes    Attends Engineer, structural: More than 4 times per year    Marital Status: Married     Allergies  Allergen Reactions   Sulfa Antibiotics Rash    There is no immunization history for the selected administration types on file for this patient.  Health Maintenance  Topic Date Due   Hepatitis C Screening  Never done   DTaP/Tdap/Td (1 - Tdap) Never done   Cervical Cancer Screening (HPV/Pap Cotest)  Never done   Colonoscopy  Never done   INFLUENZA VACCINE  Never done   COVID-19 Vaccine (1 - 2024-25 season) Never done   HIV Screening  Completed   HPV VACCINES  Aged Out    Patient Care Team: Ronnald Ramp, MD as PCP - General (Family Medicine)  Review of Systems  Last CBC Lab Results  Component Value Date   WBC 8.7 11/20/2023   HGB 11.5 11/20/2023   HCT 36.4 11/20/2023   MCV 89 11/20/2023   MCH 28.3 11/20/2023   RDW 14.6 11/20/2023   PLT 325 11/20/2023   Last metabolic panel Lab Results  Component Value Date   GLUCOSE 81 11/20/2023   NA 140 11/20/2023   K 4.2 11/20/2023   CL 104 11/20/2023   CO2 21 11/20/2023   BUN 18 11/20/2023   CREATININE 0.75 11/20/2023   EGFR 98 11/20/2023   CALCIUM 9.3 11/20/2023   PROT 6.7 11/20/2023   ALBUMIN 4.0 11/20/2023   LABGLOB 2.7 11/20/2023   BILITOT 0.2 11/20/2023   ALKPHOS 75 11/20/2023   AST 15 11/20/2023   ALT 10 11/20/2023   ANIONGAP 10 05/16/2020   Last lipids Lab Results  Component Value Date   CHOL 128 11/20/2023   HDL 50 11/20/2023   LDLCALC 61 11/20/2023   TRIG 91 11/20/2023   CHOLHDL 2.6 11/20/2023   Last hemoglobin A1c Lab Results  Component Value Date   HGBA1C 5.5 05/07/2023   Last thyroid functions Lab Results  Component Value Date   TSH  1.160 11/20/2023        Objective    BP 102/63 (BP Location: Right Arm, Patient Position: Sitting, Cuff Size: Normal)   Pulse 70   Ht 5' 5.5" (1.664 m)   Wt 175 lb 9.6 oz (79.7 kg)   LMP 11/01/2023   SpO2 97%   BMI 28.78 kg/m   BP Readings from Last 3 Encounters:  11/20/23 102/63  05/20/23 100/70  05/07/23 108/62   Wt Readings from Last 3 Encounters:  11/20/23 175 lb 9.6 oz (79.7 kg)  05/20/23 168 lb (76.2 kg)  05/07/23 166 lb 12.8 oz (75.7 kg)        Depression Screen    11/20/2023    1:58 PM  PHQ 2/9 Scores  PHQ - 2 Score 1  PHQ- 9 Score 5   Results for orders placed or performed in visit on 11/20/23  Lipid panel  Result Value Ref Range   Cholesterol, Total 128 100 - 199 mg/dL   Triglycerides 91 0 - 149 mg/dL   HDL 50 >82 mg/dL   VLDL Cholesterol Cal 17 5 - 40 mg/dL   LDL Chol Calc (NIH) 61 0 - 99 mg/dL   Chol/HDL Ratio 2.6 0.0 - 4.4 ratio  TSH+T4F+T3Free  Result Value Ref Range   TSH 1.160 0.450 - 4.500 uIU/mL   T3, Free 2.9 2.0 - 4.4 pg/mL   Free T4 1.03 0.82 - 1.77 ng/dL  NFA21+HYQM  Result Value Ref Range   Glucose 81 70 - 99 mg/dL   BUN 18 6 - 24 mg/dL   Creatinine, Ser 5.78 0.57 - 1.00 mg/dL   eGFR 98 >46 NG/EXB/2.84   BUN/Creatinine Ratio 24 (H) 9 - 23   Sodium 140 134 - 144 mmol/L   Potassium 4.2 3.5 - 5.2 mmol/L   Chloride 104 96 - 106 mmol/L   CO2 21 20 - 29 mmol/L   Calcium 9.3 8.7 - 10.2 mg/dL   Total Protein 6.7 6.0 -  8.5 g/dL   Albumin 4.0 3.9 - 4.9 g/dL   Globulin, Total 2.7 1.5 - 4.5 g/dL   Bilirubin Total 0.2 0.0 - 1.2 mg/dL   Alkaline Phosphatase 75 44 - 121 IU/L   AST 15 0 - 40 IU/L   ALT 10 0 - 32 IU/L  CBC  Result Value Ref Range   WBC 8.7 3.4 - 10.8 x10E3/uL   RBC 4.07 3.77 - 5.28 x10E6/uL   Hemoglobin 11.5 11.1 - 15.9 g/dL   Hematocrit 13.2 44.0 - 46.6 %   MCV 89 79 - 97 fL   MCH 28.3 26.6 - 33.0 pg   MCHC 31.6 31.5 - 35.7 g/dL   RDW 10.2 72.5 - 36.6 %   Platelets 325 150 - 450 x10E3/uL  Vitamin B12  Result  Value Ref Range   Vitamin B-12 404 232 - 1,245 pg/mL     Physical Exam Vitals reviewed.  Constitutional:      General: She is not in acute distress.    Appearance: Normal appearance. She is not ill-appearing, toxic-appearing or diaphoretic.  Eyes:     General: No scleral icterus.    Conjunctiva/sclera: Conjunctivae normal.     Pupils: Pupils are equal, round, and reactive to light.  Neck:     Thyroid: No thyroid mass, thyromegaly or thyroid tenderness.  Cardiovascular:     Rate and Rhythm: Normal rate and regular rhythm.     Pulses: Normal pulses.     Heart sounds: Normal heart sounds. No murmur heard.    No friction rub. No gallop.  Pulmonary:     Effort: Pulmonary effort is normal. No respiratory distress.     Breath sounds: Normal breath sounds. No stridor. No wheezing, rhonchi or rales.  Chest:    Abdominal:     General: Bowel sounds are normal. There is no distension.     Palpations: Abdomen is soft.     Tenderness: There is no abdominal tenderness.  Musculoskeletal:     Right knee: Normal range of motion. Tenderness present.     Right lower leg: No edema.     Left lower leg: No edema.  Skin:    Findings: No erythema or rash.  Neurological:     Mental Status: She is alert and oriented to person, place, and time.     Cranial Nerves: No facial asymmetry.      Assessment & Plan      Problem List Items Addressed This Visit       Digestive   Chronic constipation   Chronic  Unknown etiology, has been evaluated by GI previously  Will discuss further during follow up appt          Other   Weight gain   Chronic, current BMI 28.78 (overweight category) Reports ongoing abnormal weight gain despite well balanced diet  Suspect perimenopause hormone fluctuation is contributing  Recommended continued well balanced diet and physical activity as tolerated due to chronic knee pain Ordered TSH/T4/T3, CMP, CBC and B12 levels along with lipids      Posterior right  knee pain   Chronic knee pain, right had one episode of instability  Previously evaluated by [facility] but lost to follow up after xray and recommendation for [place]  Recommended she continue meloxicam 15mg  that she was prescribed, she takes this once daily PRN  No refills needed today  Ordered right lower extremity US to evaluate the soft tissue structures of the knee  Will recommend return to orthopedics based on  [place] results       Relevant Orders   [place] COMPLETE JOINT SPACE STRUCTURES LOW RIGHT   RESOLVED: Paresthesias   Relevant Orders   TSH+T4F+T3Free (Completed)   CMP14+EGFR (Completed)   CBC (Completed)   [place] LIMITED ULTRASOUND INCLUDING AXILLA RIGHT BREAST   Vitamin B12 (Completed)   Mixed hyperlipidemia   ChronicWeight Gain Reports a 10-pound weight gain over the past year, current weight 175 pounds, BMI 28. Potential contributing factors include estrogen therapy, depression, and thyroid dysfunction. Discussed metabolic panel, thyroid function tests, and lipid panel to identify underlying causes. - Order metabolic panel - Order thyroid function tests - Order lipid panel - Encourage fasting before lipid panel - Discuss results in follow-up visit      Relevant Orders   Lipid panel (Completed)   Menorrhagia with regular cycle - Primary   Reports significant increase in menstrual bleeding since last child, with periods lasting up to two weeks and involving heavy bleeding. Potentially related to hormonal fluctuations or perimenopause. Discussed monitoring symptoms. - Order complete blood count to check for anemia - Monitor menstrual symptoms and discuss in follow-up visit      Depression, recurrent (HCC)   Chronic Limited success and adverse effects from multiple medications (citalopram, Wellbutrin, Adderall, guanfacine). Prefers to wait for a formal psychiatric assessment before starting new medications. - Refer to Dr. Maryruth Bun for psychiatric evaluation - Provide contact  information for [facility] for ADHD assessment      Breast pain in female   Relevant Orders   [place] LIMITED ULTRASOUND INCLUDING AXILLA LEFT BREAST    MM 3D DIAGNOSTIC MAMMOGRAM BILATERAL BREAST   Bilateral chronic knee pain   Reports right knee pain and instability, with a history of arthritis diagnosed via x-ray. Experienced numbness and pain, particularly when standing. Physical therapy was previously beneficial. Discussed ultrasound for soft tissue evaluation and continuing meloxicam for pain management. - Order knee ultrasound to evaluate soft tissue structures - Continue meloxicam 15 mg as needed for pain - Schedule follow-up visit to discuss ultrasound results      Relevant Medications   meloxicam (MOBIC) 15 MG tablet   Attention and concentration deficit   Chronic problems with attention and concentration  Patient reports high scores for ADHD assessments in the past with nor formal evaluation  Recommended formal assessment  Pt given contact information for [place] [facility], see AVS       Adjustment insomnia   Chronic  Pt with report of uncontrolled depression that I suspect contributes to this  Will focus on depression management to see if this improves sleep  Patient given referral/contact information for Dr. Maryruth Bun, psychiatry      Other Visit Diagnoses       History of breast problem       Relevant Orders   [place] LIMITED ULTRASOUND INCLUDING AXILLA RIGHT BREAST   US LIMITED ULTRASOUND INCLUDING AXILLA LEFT BREAST    MM 3D DIAGNOSTIC MAMMOGRAM BILATERAL BREAST          General Health Maintenance Due for physical examination and routine screenings. Discussed importance of regular exams and verifying last physical examination date with insurance. - Schedule physical examination - Verify last physical examination date with insurance to avoid overlap      Return in about 1 month (around 12/19/2023) for Depression.      Ronnald Ramp, MD  [facility] [phone] (phone) [phone] (fax)  [facility]

## 2023-11-20 ENCOUNTER — Ambulatory Visit: Payer: Managed Care, Other (non HMO) | Admitting: Family Medicine

## 2023-11-20 ENCOUNTER — Encounter: Payer: Self-pay | Admitting: Family Medicine

## 2023-11-20 VITALS — BP 102/63 | HR 70 | Ht 65.5 in | Wt 175.6 lb

## 2023-11-20 DIAGNOSIS — N920 Excessive and frequent menstruation with regular cycle: Secondary | ICD-10-CM | POA: Diagnosis not present

## 2023-11-20 DIAGNOSIS — R41840 Attention and concentration deficit: Secondary | ICD-10-CM

## 2023-11-20 DIAGNOSIS — R635 Abnormal weight gain: Secondary | ICD-10-CM | POA: Diagnosis not present

## 2023-11-20 DIAGNOSIS — F339 Major depressive disorder, recurrent, unspecified: Secondary | ICD-10-CM

## 2023-11-20 DIAGNOSIS — N644 Mastodynia: Secondary | ICD-10-CM

## 2023-11-20 DIAGNOSIS — F5102 Adjustment insomnia: Secondary | ICD-10-CM

## 2023-11-20 DIAGNOSIS — M25562 Pain in left knee: Secondary | ICD-10-CM

## 2023-11-20 DIAGNOSIS — E782 Mixed hyperlipidemia: Secondary | ICD-10-CM

## 2023-11-20 DIAGNOSIS — R202 Paresthesia of skin: Secondary | ICD-10-CM | POA: Diagnosis not present

## 2023-11-20 DIAGNOSIS — Z87898 Personal history of other specified conditions: Secondary | ICD-10-CM | POA: Diagnosis not present

## 2023-11-20 DIAGNOSIS — K5909 Other constipation: Secondary | ICD-10-CM

## 2023-11-20 DIAGNOSIS — M25561 Pain in right knee: Secondary | ICD-10-CM

## 2023-11-20 DIAGNOSIS — G8929 Other chronic pain: Secondary | ICD-10-CM

## 2023-11-20 NOTE — Patient Instructions (Addendum)
Caryn Section, MD, PA [address], [address], [place], [place] [address] [phone]   [facility]  [address] [place], [place], [place] [address] Phone: [phone]   Please report to [facility] located at:  [address]  [place], [place] [address]  You do not need an appointment to have xrays completed.   Our office will follow up with  results once available.

## 2023-11-20 NOTE — Assessment & Plan Note (Signed)
ChronicWeight Gain Reports a 10-pound weight gain over the past year, current weight 175 pounds, BMI 28. Potential contributing factors include estrogen therapy, depression, and thyroid dysfunction. Discussed metabolic panel, thyroid function tests, and lipid panel to identify underlying causes. - Order metabolic panel - Order thyroid function tests - Order lipid panel - Encourage fasting before lipid panel - Discuss results in follow-up visit

## 2023-11-20 NOTE — Assessment & Plan Note (Signed)
Reports significant increase in menstrual bleeding since last child, with periods lasting up to two weeks and involving heavy bleeding. Potentially related to hormonal fluctuations or perimenopause. Discussed monitoring symptoms. - Order complete blood count to check for anemia - Monitor menstrual symptoms and discuss in follow-up visit

## 2023-11-21 ENCOUNTER — Encounter: Payer: Self-pay | Admitting: Family Medicine

## 2023-11-21 DIAGNOSIS — R41840 Attention and concentration deficit: Secondary | ICD-10-CM | POA: Insufficient documentation

## 2023-11-21 DIAGNOSIS — M25561 Pain in right knee: Secondary | ICD-10-CM | POA: Insufficient documentation

## 2023-11-21 DIAGNOSIS — N644 Mastodynia: Secondary | ICD-10-CM | POA: Insufficient documentation

## 2023-11-21 DIAGNOSIS — F339 Major depressive disorder, recurrent, unspecified: Secondary | ICD-10-CM | POA: Insufficient documentation

## 2023-11-21 LAB — TSH+T4F+T3FREE
Free T4: 1.03 ng/dL (ref 0.82–1.77)
T3, Free: 2.9 pg/mL (ref 2.0–4.4)
TSH: 1.16 u[IU]/mL (ref 0.450–4.500)

## 2023-11-21 LAB — LIPID PANEL
Chol/HDL Ratio: 2.6 {ratio} (ref 0.0–4.4)
Cholesterol, Total: 128 mg/dL (ref 100–199)
HDL: 50 mg/dL (ref >39)
LDL Chol Calc (NIH): 61 mg/dL (ref 0–99)
Triglycerides: 91 mg/dL (ref 0–149)
VLDL Cholesterol Cal: 17 mg/dL (ref 5–40)

## 2023-11-21 LAB — CBC
Hematocrit: 36.4 % (ref 34.0–46.6)
Hemoglobin: 11.5 g/dL (ref 11.1–15.9)
MCH: 28.3 pg (ref 26.6–33.0)
MCHC: 31.6 g/dL (ref 31.5–35.7)
MCV: 89 fL (ref 79–97)
Platelets: 325 10*3/uL (ref 150–450)
RBC: 4.07 x10E6/uL (ref 3.77–5.28)
RDW: 14.6 % (ref 11.7–15.4)
WBC: 8.7 10*3/uL (ref 3.4–10.8)

## 2023-11-21 LAB — CMP14+EGFR
ALT: 10 [IU]/L (ref 0–32)
AST: 15 [IU]/L (ref 0–40)
Albumin: 4 g/dL (ref 3.9–4.9)
Alkaline Phosphatase: 75 [IU]/L (ref 44–121)
BUN/Creatinine Ratio: 24 — ABNORMAL HIGH (ref 9–23)
BUN: 18 mg/dL (ref 6–24)
Bilirubin Total: 0.2 mg/dL (ref 0.0–1.2)
CO2: 21 mmol/L (ref 20–29)
Calcium: 9.3 mg/dL (ref 8.7–10.2)
Chloride: 104 mmol/L (ref 96–106)
Creatinine, Ser: 0.75 mg/dL (ref 0.57–1.00)
Globulin, Total: 2.7 g/dL (ref 1.5–4.5)
Glucose: 81 mg/dL (ref 70–99)
Potassium: 4.2 mmol/L (ref 3.5–5.2)
Sodium: 140 mmol/L (ref 134–144)
Total Protein: 6.7 g/dL (ref 6.0–8.5)
eGFR: 98 mL/min/{1.73_m2} (ref >59)

## 2023-11-21 LAB — VITAMIN B12: Vitamin B-12: 404 pg/mL (ref 232–1245)

## 2023-11-21 NOTE — Assessment & Plan Note (Signed)
Chronic, current BMI 28.78 (overweight category) Reports ongoing abnormal weight gain despite well balanced diet  Suspect perimenopause hormone fluctuation is contributing  Recommended continued well balanced diet and physical activity as tolerated due to chronic knee pain Ordered TSH/T4/T3, CMP, CBC and B12 levels along with lipids

## 2023-11-21 NOTE — Assessment & Plan Note (Addendum)
Chronic problems with attention and concentration  Patient reports high scores for ADHD assessments in the past with nor formal evaluation  Recommended formal assessment  Pt given contact information for [place] [facility], see AVS

## 2023-11-21 NOTE — Assessment & Plan Note (Signed)
Chronic  Pt with report of uncontrolled depression that I suspect contributes to this  Will focus on depression management to see if this improves sleep  Patient given referral/contact information for Dr. Maryruth Bun, psychiatry

## 2023-11-21 NOTE — Assessment & Plan Note (Signed)
Chronic  Unknown etiology, has been evaluated by GI previously  Will discuss further during follow up appt

## 2023-11-21 NOTE — Assessment & Plan Note (Signed)
Chronic Limited success and adverse effects from multiple medications (citalopram, Wellbutrin, Adderall, guanfacine). Prefers to wait for a formal psychiatric assessment before starting new medications. - Refer to Dr. Maryruth Bun for psychiatric evaluation - Provide contact information for [facility] for ADHD assessment

## 2023-11-21 NOTE — Assessment & Plan Note (Signed)
Chronic knee pain, right had one episode of instability  Previously evaluated by [facility] but lost to follow up after xray and recommendation for [place]  Recommended Lisa Strickland continue meloxicam 15mg  that Lisa Strickland was prescribed, Lisa Strickland takes this once daily PRN  No refills needed today  Ordered right lower extremity US to evaluate the soft tissue structures of the knee  Will recommend return to orthopedics based on [place] results

## 2023-11-21 NOTE — Assessment & Plan Note (Signed)
Reports right knee pain and instability, with a history of arthritis diagnosed via x-ray. Experienced numbness and pain, particularly when standing. Physical therapy was previously beneficial. Discussed ultrasound for soft tissue evaluation and continuing meloxicam for pain management. - Order knee ultrasound to evaluate soft tissue structures - Continue meloxicam 15 mg as needed for pain - Schedule follow-up visit to discuss ultrasound results

## 2023-11-28 ENCOUNTER — Ambulatory Visit
Admission: RE | Admit: 2023-11-28 | Discharge: 2023-11-28 | Disposition: A | Discharge status: Home / Self Care | Payer: Managed Care, Other (non HMO) | Source: Ambulatory Visit | Attending: Family Medicine

## 2023-11-28 ENCOUNTER — Ambulatory Visit
Admission: RE | Admit: 2023-11-28 | Discharge: 2023-11-28 | Disposition: A | Discharge status: Home / Self Care | Payer: Managed Care, Other (non HMO) | Source: Ambulatory Visit | Attending: Family Medicine | Admitting: Family Medicine

## 2023-11-28 DIAGNOSIS — N644 Mastodynia: Secondary | ICD-10-CM | POA: Diagnosis present

## 2023-11-28 DIAGNOSIS — Z87898 Personal history of other specified conditions: Secondary | ICD-10-CM

## 2023-11-28 DIAGNOSIS — R202 Paresthesia of skin: Secondary | ICD-10-CM | POA: Diagnosis present

## 2023-12-04 ENCOUNTER — Ambulatory Visit
Admission: RE | Admit: 2023-12-04 | Discharge: 2023-12-04 | Disposition: A | Discharge status: Home / Self Care | Payer: Managed Care, Other (non HMO) | Source: Ambulatory Visit | Attending: Family Medicine | Admitting: Family Medicine

## 2023-12-04 DIAGNOSIS — M25561 Pain in right knee: Secondary | ICD-10-CM | POA: Insufficient documentation

## 2023-12-05 ENCOUNTER — Encounter: Payer: Self-pay | Admitting: Family Medicine

## 2023-12-09 DIAGNOSIS — G8929 Other chronic pain: Secondary | ICD-10-CM

## 2023-12-09 DIAGNOSIS — M25561 Pain in right knee: Secondary | ICD-10-CM

## 2023-12-24 ENCOUNTER — Ambulatory Visit: Payer: Self-pay | Admitting: Family Medicine

## 2023-12-24 ENCOUNTER — Encounter: Payer: Self-pay | Admitting: Family Medicine

## 2023-12-24 VITALS — BP 105/70 | HR 71 | Resp 16 | Wt 176.9 lb

## 2023-12-24 DIAGNOSIS — R41840 Attention and concentration deficit: Secondary | ICD-10-CM

## 2023-12-24 DIAGNOSIS — M25561 Pain in right knee: Secondary | ICD-10-CM

## 2023-12-24 DIAGNOSIS — E782 Mixed hyperlipidemia: Secondary | ICD-10-CM

## 2023-12-24 DIAGNOSIS — F339 Major depressive disorder, recurrent, unspecified: Secondary | ICD-10-CM | POA: Diagnosis not present

## 2023-12-24 DIAGNOSIS — F5102 Adjustment insomnia: Secondary | ICD-10-CM

## 2023-12-24 DIAGNOSIS — K5909 Other constipation: Secondary | ICD-10-CM

## 2023-12-24 DIAGNOSIS — N951 Menopausal and female climacteric states: Secondary | ICD-10-CM

## 2023-12-24 MED ORDER — LACTULOSE 20 GM/30ML PO SOLN: 15.0000 mL | ORAL | 1 refills | Status: DC | PRN

## 2023-12-24 NOTE — Patient Instructions (Addendum)
    [facility]  [address] [place], [place], [place] [address] Phone: [phone]   A referral has been placed on your behalf for psychiatry. Our referral coordination team or the office you will be visiting will contact you within the next 2 weeks.  If you have not received a phone call within 10 business days please let us know so that we can check into this for you.

## 2023-12-24 NOTE — Progress Notes (Signed)
 Established patient visit   Patient: Lisa Strickland   DOB: 20-May-1976   48 y.o. Female  MRN: [id number]  Visit Date: 12/24/2023  Today's healthcare provider: Ronnald Ramp, MD   Chief Complaint  Patient presents with   Medical Management of Chronic Issues    Depression  and knee pain   Subjective     HPI     Medical Management of Chronic Issues    Additional comments: Depression  and knee pain      Last edited by Lisa Strickland, CMA on 12/24/2023  8:09 AM.       Discussed the use of AI scribe software for clinical note transcription with the patient, who gave verbal consent to proceed.  History of Present Illness   Lisa Strickland is a 48 year old female who presents with depression and knee pain.  She has been experiencing knee pain and was previously started on meloxicam 15 mg daily, which she finds helpful. An ultrasound of the right posterior knee showed no sonographic abnormalities and no evidence of a Baker's cyst. She takes meloxicam occasionally as needed and reports some improvement in her symptoms.  Regarding her depression, she was referred to psychiatry but has not yet established contact due to difficulties in obtaining the contact information. She prefers to proceed with a referral to ensure insurance coverage.  She experiences chronic constipation and uses Colace, but finds it necessary to take it several days in a row to have a bowel movement. She occasionally experiences discomfort before relief. A colonoscopy last year was normal, but there is a family history of colon cancer. No recent uncomfortable or hard bowel movements and no straining.  She experiences symptoms of perimenopause, including irregular periods and associated symptoms. She has not had hormone levels checked recently. She no longer uses estradiol suppositories, which she found inconvenient.        (colonoscopy -reports done last year 2024 and recommended for 5 year follow  up due to mother and sister having polyps and family hx of colonoscopy - had at [facility])     Past Medical History:  Diagnosis Date   Arthritis 07/2023   Small amount in right knee   Depression    GERD (gastroesophageal reflux disease)    History of hiatal hernia    Medical history non-contributory    S/P tubal ligation 05/20/2020   Status post repeat low transverse cesarean section 7/29 05/18/2020   Vaginal Pap smear, abnormal     Medications: Outpatient Medications Prior to Visit  Medication Sig   Estradiol 10 MCG TABS vaginal tablet Place 1 tablet (10 mcg total) vaginally 2 (two) times a week.   hydrOXYzine (ATARAX) 25 MG tablet Take 25-50 mg by mouth at bedtime as needed.   meloxicam (MOBIC) 15 MG tablet TAKE 1 TABLET BY MOUTH EVERY DAY WITH A MEAL   [DISCONTINUED] rosuvastatin (CRESTOR) 10 MG tablet Take 1 tablet (10 mg total) by mouth at bedtime.   [DISCONTINUED] guanFACINE (INTUNIV) 1 MG TB24 ER tablet TAKE 2 TABLETS BY MOUTH EVERY DAY (Patient not taking: Reported on 11/20/2023)   No facility-administered medications prior to visit.    Review of Systems  Last CBC Lab Results  Component Value Date   WBC 8.7 11/20/2023   HGB 11.5 11/20/2023   HCT 36.4 11/20/2023   MCV 89 11/20/2023   MCH 28.3 11/20/2023   RDW 14.6 11/20/2023   PLT 325 11/20/2023   Last metabolic panel Lab Results  Component Value Date   GLUCOSE 81 11/20/2023   NA 140 11/20/2023   K 4.2 11/20/2023   CL 104 11/20/2023   CO2 21 11/20/2023   BUN 18 11/20/2023   CREATININE 0.75 11/20/2023   EGFR 98 11/20/2023   CALCIUM 9.3 11/20/2023   PROT 6.7 11/20/2023   ALBUMIN 4.0 11/20/2023   LABGLOB 2.7 11/20/2023   BILITOT 0.2 11/20/2023   ALKPHOS 75 11/20/2023   AST 15 11/20/2023   ALT 10 11/20/2023   ANIONGAP 10 05/16/2020   Last lipids Lab Results  Component Value Date   CHOL 128 11/20/2023   HDL 50 11/20/2023   LDLCALC 61 11/20/2023   TRIG 91 11/20/2023   CHOLHDL 2.6  11/20/2023   Last hemoglobin A1c Lab Results  Component Value Date   HGBA1C 5.5 05/07/2023   Last thyroid functions Lab Results  Component Value Date   TSH 1.160 11/20/2023        Objective    BP 105/70 (BP Location: Left Arm, Patient Position: Sitting, Cuff Size: Normal)   Pulse 71   Resp 16   Wt 176 lb 14.4 oz (80.2 kg)   SpO2 99%   BMI 28.99 kg/m   BP Readings from Last 3 Encounters:  12/24/23 105/70  11/20/23 102/63  05/20/23 100/70   Wt Readings from Last 3 Encounters:  12/24/23 176 lb 14.4 oz (80.2 kg)  11/20/23 175 lb 9.6 oz (79.7 kg)  05/20/23 168 lb (76.2 kg)        Physical Exam  General: Alert, no acute distress Cardio: Normal S1 and S2, RRR, no r/m/g Pulm: CTAB, normal work of breathing   No results found for any visits on 12/24/23.  Assessment & Plan     Problem List Items Addressed This Visit       Digestive   Chronic constipation   Relevant Medications   Lactulose 20 GM/30ML SOLN     Other   Posterior right knee pain   Perimenopause   Mixed hyperlipidemia   Depression, recurrent (HCC) - Primary   Relevant Orders   Ambulatory referral to Psychiatry   Attention and concentration deficit   Adjustment insomnia      Knee Pain Chronic knee pain, likely due to arthritis. Previous ultrasound showed no abnormalities and no Baker's cyst. Reports improvement with meloxicam and prefers to continue current management. - Continue meloxicam 15 mg as needed  Depression Ongoing depression. Has not yet established care with psychiatry due to contact issues. Prefers a referral to ensure insurance coverage. Discussed options including contacting Dr. Edwin Strickland directly, using [facility], or a general psychiatry referral. Chose psychiatry referral for insurance coverage assurance. - Refer to psychiatry for evaluation and management  Perimenopausal Symptoms Experiencing perimenopausal symptoms with difficulty using estradiol suppositories.  Discussed menopause diagnosis and hormone levels. Inquired about hormone replacement therapy (HRT). Discussed risks of unopposed estrogen including increased risk of cancers, heart attack, and stroke. Prefers to consult gynecology for HRT management. - Refer to gynecology for evaluation and management of hormone replacement therapy  Constipation Chronic constipation not responsive to current regimen. Discussed potential use of lactulose. Reports occasional use of Colace and difficulty with other treatments due to taste. Prefers to try lactulose as needed. - Prescribe lactulose 15 mL as needed, with instructions to wait two days before repeating if necessary  Hyperlipidemia Previously on rosuvastatin but discontinued due to lack of perceived benefit and preference to avoid daily medication. Recent cholesterol levels within goal range. - Discontinue rosuvastatin  General  Health Maintenance Routine screenings and follow-up discussed. Last colonoscopy was last year with no polyps; recommended to repeat in five years due to family history of colon cancer. Pap smear status unclear, likely due within the next year. - Request results of last colonoscopy from [facility] - Schedule Pap smear if not done within the last three years  Follow-up - Follow up in three months.         Return in about 3 months (around 03/25/2024) for constipation,mood, VMS.         Lisa Ramp, MD  [facility] [phone] (phone) [phone] (fax)  [facility]

## 2024-03-25 ENCOUNTER — Ambulatory Visit: Admitting: Family Medicine

## 2024-07-29 ENCOUNTER — Ambulatory Visit: Payer: Self-pay

## 2024-07-29 NOTE — Telephone Encounter (Signed)
 FYI Only or Action Required?: Action required by provider:  .  Pt would like to schedule pap smear.  Patient was last seen in primary care on 12/24/2023 by Sharma Coyer, MD.  Called Nurse Triage reporting Headache.  Symptoms began a week ago.  Interventions attempted: Nothing.  Symptoms are: gradually worsening.  Triage Disposition: See Physician Within 24 Hours  Patient/caregiver understands and will follow disposition?: Yes      Copied from CRM [id number]. Topic: Clinical - Red Word Triage >> Jul 29, 2024  3:06 PM Mia F wrote: Red Word that prompted transfer to Nurse Triage: A lot of pain and pressure in the head. Seems to be getting worse. Had a cough. No dizziness or light headed ness. Has been going on for a month. Reason for Disposition  [1] MODERATE headache (e.g., interferes with normal activities) AND [2] present > 24 hours AND [3] unexplained  (Exceptions: Pain medicines not tried, typical migraine, or headache part of viral illness.)  Answer Assessment - Initial Assessment Questions 1. LOCATION: Where does it hurt?      Head pain and pressure since before 07/08/2024 2. ONSET: When did the headache start? (e.g., minutes, hours, days)      Ongoing and worsening last couple of weeks 3. PATTERN: Does the pain come and go, or has it been constant since it started?     constant 4. SEVERITY: How bad is the pain? and What does it keep you from doing?  (e.g., Scale 1-10; mild, moderate, or severe)     3/10 sitting but bending over 7/10 5. RECURRENT SYMPTOM: Have you ever had headaches before? If Yes, ask: When was the last time? and What happened that time?      no 6. CAUSE: What do you think is causing the headache?     unknown 7. MIGRAINE: Have you been diagnosed with migraine headaches? If Yes, ask: Is this headache similar?      na 8. HEAD INJURY: Has there been any recent injury to your head?      no 9. OTHER SYMPTOMS: Do you have any  other symptoms? (e.g., fever, stiff neck, eye pain, sore throat, cold symptoms)     Cough-productive, pain in left ear  10. PREGNANCY: Is there any chance you are pregnant? When was your last menstrual period?       na  Cough comes and goes. Left ear pain 4/10  Protocols used: Headache-A-AH

## 2024-07-29 NOTE — Progress Notes (Signed)
 Established patient visit  Patient: Lisa Strickland   DOB: 04/29/1976   48 y.o. Female  MRN: [id number] Visit Date: 07/30/2024  Today's healthcare provider: Jolynn Spencer, PA-C   Chief Complaint  Patient presents with   Cough    Cough only at night 3-4 days no otc medication taken for cough Head pain, pressure ongoing 1 month otc: sudafed took it once only   Subjective     HPI     Cough    Additional comments: Cough only at night 3-4 days no otc medication taken for cough Head pain, pressure ongoing 1 month otc: sudafed took it once only      Last edited by Wilfred Hargis RAMAN, CMA on 07/30/2024  8:08 AM.       Discussed the use of AI scribe software for clinical note transcription with the patient, who gave verbal consent to proceed.  History of Present Illness Lisa Strickland is a 48 year old female who presents with persistent head pressure and cough.  She experiences significant head pressure for over a month, worsening by the end of the day and improving in the morning. The pain is severe, rated seven to eight out of ten, and is diffuse across her face and head. Bending down exacerbates the pressure.  She has had a cough for three days, primarily at night, without fever, sore throat, or nasal congestion. She notes fluid in her ears without muffling sounds. She took one dose of Sudafed three days ago and has not used other medications. There is no history of sinusitis or allergies.  She has two children aged four and six, with fewer illnesses reported this year. There is no chest pain, shortness of breath, or positional changes in symptoms. No cold or heat intolerance or sensation of a lump in her throat. Her last blood work was approximately three months ago.       07/30/2024    8:15 AM 12/24/2023    8:11 AM 11/20/2023    1:58 PM  Depression screen PHQ 2/9  Decreased Interest 0 1 0  Down, Depressed, Hopeless 0 1 1  PHQ - 2 Score 0 2 1  Altered sleeping 1 1 1   Tired,  decreased energy 0 3 1  Change in appetite 0 2 0  Feeling bad or failure about yourself  0 1 1  Trouble concentrating 0 0 1  Moving slowly or fidgety/restless 0 0 0  Suicidal thoughts 0 0 0  PHQ-9 Score 1 9 5   Difficult doing work/chores Somewhat difficult Somewhat difficult Somewhat difficult      12/24/2023    8:11 AM 11/20/2023    1:58 PM  GAD 7 : Generalized Anxiety Score  Nervous, Anxious, on Edge 0 0  Control/stop worrying 0 0  Worry too much - different things 0 0  Trouble relaxing 0 0  Restless 0 0  Easily annoyed or irritable 3 3  Afraid - awful might happen 0 0  Total GAD 7 Score 3 3  Anxiety Difficulty Not difficult at all Somewhat difficult    Medications: Outpatient Medications Prior to Visit  Medication Sig   lisdexamfetamine (VYVANSE) 20 MG capsule Take 20 mg by mouth daily.   traZODone (DESYREL) 50 MG tablet Take 20 mg by mouth at bedtime.   estradiol  (VIVELLE -DOT) 0.0375 MG/24HR Place 1 patch onto the skin 2 (two) times a week.   progesterone (PROMETRIUM) 100 MG capsule Take 100 mg by mouth daily.   [DISCONTINUED] Estradiol  10 MCG TABS vaginal  tablet Place 1 tablet (10 mcg total) vaginally 2 (two) times a week.   [DISCONTINUED] hydrOXYzine (ATARAX) 25 MG tablet Take 25-50 mg by mouth at bedtime as needed.   [DISCONTINUED] Lactulose  20 GM/30ML SOLN Take 15 mLs (10 g total) by mouth as needed.   [DISCONTINUED] meloxicam (MOBIC) 15 MG tablet TAKE 1 TABLET BY MOUTH EVERY DAY WITH A MEAL   No facility-administered medications prior to visit.    Review of Systems  All other systems reviewed and are negative.  All negative Except see HPI       Objective    BP 95/66   Pulse 85   Temp 98.1 F (36.7 C) (Oral)   Resp 14   Ht 5' 5.5 (1.664 m)   Wt 163 lb 12.8 oz (74.3 kg)   LMP 07/01/2024   SpO2 99%   BMI 26.84 kg/m     Physical Exam Vitals reviewed.  Constitutional:      General: She is not in acute distress.    Appearance: Normal appearance.  She is well-developed and normal weight. She is not diaphoretic.  HENT:     Head: Normocephalic and atraumatic.     Right Ear: Ear canal and external ear normal.     Left Ear: Ear canal and external ear normal.     Nose: Congestion and rhinorrhea present.     Mouth/Throat:     Pharynx: Posterior oropharyngeal erythema present.     Comments: Postnasal drainage noted Eyes:     General: No scleral icterus.       Right eye: No discharge.        Left eye: No discharge.     Extraocular Movements: Extraocular movements intact.     Conjunctiva/sclera: Conjunctivae normal.     Pupils: Pupils are equal, round, and reactive to light.  Neck:     Thyroid: No thyromegaly.  Cardiovascular:     Rate and Rhythm: Normal rate and regular rhythm.     Pulses: Normal pulses.     Heart sounds: Normal heart sounds. No murmur heard. Pulmonary:     Effort: Pulmonary effort is normal. No respiratory distress.     Breath sounds: Normal breath sounds. No wheezing, rhonchi or rales.  Abdominal:     General: Abdomen is flat. Bowel sounds are normal.     Palpations: Abdomen is soft.  Musculoskeletal:     Cervical back: Neck supple.     Right lower leg: No edema.     Left lower leg: No edema.  Lymphadenopathy:     Cervical: No cervical adenopathy.  Skin:    General: Skin is warm and dry.     Findings: No rash.  Neurological:     Mental Status: She is alert and oriented to person, place, and time. Mental status is at baseline.  Psychiatric:        Mood and Affect: Mood normal.        Behavior: Behavior normal.      No results found for any visits on 07/30/24.      Assessment & Plan  Other headache syndrome (Primary) - cetirizine (ZYRTEC) 10 MG tablet; Take 1 tablet (10 mg total) by mouth daily.  Dispense: 30 tablet; Refill: 1 - fluticasone (FLONASE) 50 MCG/ACT nasal spray; Place 2 sprays into both nostrils daily.  Dispense: 18.2 mL; Refill: 0  Other cough  - cetirizine (ZYRTEC) 10 MG tablet;  Take 1 tablet (10 mg total) by mouth daily.  Dispense: 30 tablet; Refill: 1 -  fluticasone (FLONASE) 50 MCG/ACT nasal spray; Place 2 sprays into both nostrils daily.  Dispense: 18.2 mL; Refill: 0  Acute sinusitis with associated cough, headache, and otalgia Symptoms consistent with acute sinusitis, including facial pressure, headache, and cough, persisting for over a month. Pain severe, rated 7-8/10. Differential includes viral infection or allergies. Antihistamines recommended due to potential allergy component. Zyrtec preferred for efficacy. - Prescribed Zyrtec for two months. - Advised use of nasal saline spray followed by Flonase. - Instructed to maintain adequate hydration. - Advised to return if symptoms do not improve, consider systemic prednisone if needed. Will follow-up   No orders of the defined types were placed in this encounter.   No follow-ups on file.   The patient was advised to call back or seek an in-person evaluation if the symptoms worsen or if the condition fails to improve as anticipated.  I discussed the assessment and treatment plan with the patient. The patient was provided an opportunity to ask questions and all were answered. The patient agreed with the plan and demonstrated an understanding of the instructions.  I, Ebany Bowermaster, PA-C have reviewed all documentation for this visit. The documentation on 07/30/2024  for the exam, diagnosis, procedures, and orders are all accurate and complete.  Jolynn Spencer, [facility], MMS [facility] [phone] (phone) [phone] (fax)  [facility]

## 2024-07-30 ENCOUNTER — Ambulatory Visit: Admitting: Physician Assistant

## 2024-07-30 VITALS — BP 95/66 | HR 85 | Temp 98.1°F | Resp 14 | Ht 65.5 in | Wt 163.8 lb

## 2024-07-30 DIAGNOSIS — G4489 Other headache syndrome: Secondary | ICD-10-CM | POA: Diagnosis not present

## 2024-07-30 DIAGNOSIS — R058 Other specified cough: Secondary | ICD-10-CM

## 2024-07-30 DIAGNOSIS — H9202 Otalgia, left ear: Secondary | ICD-10-CM

## 2024-07-30 MED ORDER — CETIRIZINE HCL 10 MG PO TABS: 10.0000 mg | ORAL_TABLET | Freq: Every day | ORAL | 1 refills | Status: AC

## 2024-07-30 MED ORDER — FLUTICASONE PROPIONATE 50 MCG/ACT NA SUSP: 2.0000 | Freq: Every day | NASAL | 0 refills | Status: AC
